# Patient Record
Sex: Female | Born: 1940 | Race: Black or African American | Hispanic: No | Marital: Married | Smoking: Current some day smoker
Health system: Southern US, Community
[De-identification: ages and names within clinical notes are randomized; demographics above are authoritative.]

## PROBLEM LIST (undated history)

## (undated) DIAGNOSIS — E119 Type 2 diabetes mellitus without complications: Secondary | ICD-10-CM

## (undated) DIAGNOSIS — M1990 Unspecified osteoarthritis, unspecified site: Secondary | ICD-10-CM

## (undated) DIAGNOSIS — J449 Chronic obstructive pulmonary disease, unspecified: Secondary | ICD-10-CM

## (undated) DIAGNOSIS — I2510 Atherosclerotic heart disease of native coronary artery without angina pectoris: Secondary | ICD-10-CM

## (undated) DIAGNOSIS — E785 Hyperlipidemia, unspecified: Secondary | ICD-10-CM

## (undated) DIAGNOSIS — I10 Essential (primary) hypertension: Secondary | ICD-10-CM

## (undated) DIAGNOSIS — I739 Peripheral vascular disease, unspecified: Secondary | ICD-10-CM

## (undated) HISTORY — DX: Hyperlipidemia, unspecified: E78.5

## (undated) HISTORY — DX: Type 2 diabetes mellitus without complications: E11.9

## (undated) HISTORY — PX: OTHER SURGICAL HISTORY: SHX169

## (undated) HISTORY — DX: Essential (primary) hypertension: I10

## (undated) HISTORY — DX: Unspecified osteoarthritis, unspecified site: M19.90

## (undated) HISTORY — PX: ABDOMINAL HYSTERECTOMY: SHX81

## (undated) HISTORY — DX: Peripheral vascular disease, unspecified: I73.9

## (undated) HISTORY — DX: Atherosclerotic heart disease of native coronary artery without angina pectoris: I25.10

---

## 1997-12-21 ENCOUNTER — Emergency Department (HOSPITAL_COMMUNITY): Admission: EM | Admit: 1997-12-21 | Discharge: 1997-12-21 | Payer: Self-pay | Admitting: Emergency Medicine

## 1999-03-15 ENCOUNTER — Inpatient Hospital Stay (HOSPITAL_COMMUNITY): Admission: EM | Admit: 1999-03-15 | Discharge: 1999-03-20 | Payer: Self-pay | Admitting: Emergency Medicine

## 1999-03-15 ENCOUNTER — Encounter: Payer: Self-pay | Admitting: Emergency Medicine

## 1999-03-19 ENCOUNTER — Encounter: Payer: Self-pay | Admitting: Surgery

## 2000-04-29 ENCOUNTER — Encounter: Payer: Self-pay | Admitting: Surgery

## 2000-04-29 ENCOUNTER — Encounter: Admission: RE | Admit: 2000-04-29 | Discharge: 2000-04-29 | Payer: Self-pay | Admitting: Surgery

## 2000-04-30 ENCOUNTER — Other Ambulatory Visit: Admission: RE | Admit: 2000-04-30 | Discharge: 2000-04-30 | Payer: Self-pay | Admitting: Obstetrics and Gynecology

## 2001-04-30 ENCOUNTER — Encounter: Payer: Self-pay | Admitting: Internal Medicine

## 2001-04-30 ENCOUNTER — Encounter: Admission: RE | Admit: 2001-04-30 | Discharge: 2001-04-30 | Payer: Self-pay | Admitting: Internal Medicine

## 2002-02-08 ENCOUNTER — Other Ambulatory Visit: Admission: RE | Admit: 2002-02-08 | Discharge: 2002-02-08 | Payer: Self-pay | Admitting: Obstetrics and Gynecology

## 2002-02-16 ENCOUNTER — Ambulatory Visit (HOSPITAL_COMMUNITY): Admission: RE | Admit: 2002-02-16 | Discharge: 2002-02-16 | Payer: Self-pay | Admitting: Cardiology

## 2002-02-16 ENCOUNTER — Encounter: Payer: Self-pay | Admitting: Cardiology

## 2002-07-27 ENCOUNTER — Other Ambulatory Visit: Admission: RE | Admit: 2002-07-27 | Discharge: 2002-07-27 | Payer: Self-pay | Admitting: Obstetrics and Gynecology

## 2003-02-10 ENCOUNTER — Other Ambulatory Visit: Admission: RE | Admit: 2003-02-10 | Discharge: 2003-02-10 | Payer: Self-pay | Admitting: Obstetrics and Gynecology

## 2003-05-05 ENCOUNTER — Encounter (INDEPENDENT_AMBULATORY_CARE_PROVIDER_SITE_OTHER): Payer: Self-pay | Admitting: Specialist

## 2003-05-05 ENCOUNTER — Ambulatory Visit (HOSPITAL_COMMUNITY): Admission: RE | Admit: 2003-05-05 | Discharge: 2003-05-05 | Payer: Self-pay | Admitting: *Deleted

## 2004-03-05 ENCOUNTER — Encounter: Admission: RE | Admit: 2004-03-05 | Discharge: 2004-03-05 | Payer: Self-pay | Admitting: Obstetrics and Gynecology

## 2004-08-25 HISTORY — PX: APPENDECTOMY: SHX54

## 2005-05-14 ENCOUNTER — Ambulatory Visit: Payer: Self-pay | Admitting: Internal Medicine

## 2005-05-21 ENCOUNTER — Other Ambulatory Visit: Admission: RE | Admit: 2005-05-21 | Discharge: 2005-05-21 | Payer: Self-pay | Admitting: Obstetrics and Gynecology

## 2005-05-28 ENCOUNTER — Ambulatory Visit: Payer: Self-pay | Admitting: Internal Medicine

## 2005-07-09 ENCOUNTER — Ambulatory Visit: Payer: Self-pay | Admitting: Internal Medicine

## 2005-08-27 ENCOUNTER — Ambulatory Visit: Payer: Self-pay | Admitting: Internal Medicine

## 2005-09-03 ENCOUNTER — Ambulatory Visit: Payer: Self-pay | Admitting: Internal Medicine

## 2005-10-27 ENCOUNTER — Ambulatory Visit: Payer: Self-pay | Admitting: Internal Medicine

## 2005-10-29 ENCOUNTER — Ambulatory Visit: Payer: Self-pay | Admitting: Internal Medicine

## 2005-12-03 ENCOUNTER — Ambulatory Visit: Payer: Self-pay | Admitting: Internal Medicine

## 2006-03-02 ENCOUNTER — Ambulatory Visit: Payer: Self-pay | Admitting: Internal Medicine

## 2006-03-04 ENCOUNTER — Ambulatory Visit: Payer: Self-pay | Admitting: Internal Medicine

## 2006-04-20 ENCOUNTER — Ambulatory Visit: Payer: Self-pay | Admitting: Internal Medicine

## 2006-04-22 ENCOUNTER — Ambulatory Visit: Payer: Self-pay | Admitting: Internal Medicine

## 2006-06-19 ENCOUNTER — Ambulatory Visit (HOSPITAL_COMMUNITY): Admission: RE | Admit: 2006-06-19 | Discharge: 2006-06-19 | Payer: Self-pay | Admitting: Internal Medicine

## 2006-07-22 ENCOUNTER — Ambulatory Visit: Payer: Self-pay | Admitting: Internal Medicine

## 2007-06-08 DIAGNOSIS — I119 Hypertensive heart disease without heart failure: Secondary | ICD-10-CM

## 2007-06-08 DIAGNOSIS — E119 Type 2 diabetes mellitus without complications: Secondary | ICD-10-CM | POA: Insufficient documentation

## 2007-06-08 DIAGNOSIS — I10 Essential (primary) hypertension: Secondary | ICD-10-CM | POA: Insufficient documentation

## 2007-06-08 DIAGNOSIS — E7800 Pure hypercholesterolemia, unspecified: Secondary | ICD-10-CM | POA: Insufficient documentation

## 2007-06-08 DIAGNOSIS — K219 Gastro-esophageal reflux disease without esophagitis: Secondary | ICD-10-CM

## 2009-10-19 ENCOUNTER — Encounter (INDEPENDENT_AMBULATORY_CARE_PROVIDER_SITE_OTHER): Payer: Self-pay | Admitting: Obstetrics and Gynecology

## 2009-10-19 ENCOUNTER — Inpatient Hospital Stay (HOSPITAL_COMMUNITY): Admission: RE | Admit: 2009-10-19 | Discharge: 2009-10-21 | Payer: Self-pay | Admitting: Obstetrics and Gynecology

## 2010-02-27 ENCOUNTER — Ambulatory Visit (HOSPITAL_COMMUNITY): Admission: RE | Admit: 2010-02-27 | Discharge: 2010-02-27 | Payer: Self-pay | Admitting: Orthopedic Surgery

## 2010-02-27 ENCOUNTER — Ambulatory Visit: Payer: Self-pay | Admitting: Vascular Surgery

## 2010-04-01 ENCOUNTER — Ambulatory Visit: Payer: Self-pay | Admitting: Surgery

## 2010-04-10 ENCOUNTER — Ambulatory Visit: Payer: Self-pay | Admitting: Surgery

## 2010-04-10 ENCOUNTER — Ambulatory Visit (HOSPITAL_COMMUNITY): Admission: RE | Admit: 2010-04-10 | Discharge: 2010-04-10 | Payer: Self-pay | Admitting: Surgery

## 2010-04-15 ENCOUNTER — Ambulatory Visit: Payer: Self-pay | Admitting: Surgery

## 2010-04-25 ENCOUNTER — Inpatient Hospital Stay (HOSPITAL_COMMUNITY): Admission: RE | Admit: 2010-04-25 | Discharge: 2010-04-27 | Payer: Self-pay | Admitting: Surgery

## 2010-04-25 HISTORY — PX: PR VEIN BYPASS GRAFT,AORTO-FEM-POP: 35551

## 2010-05-13 ENCOUNTER — Ambulatory Visit: Payer: Self-pay | Admitting: Surgery

## 2010-08-14 ENCOUNTER — Ambulatory Visit: Payer: Self-pay | Admitting: Surgery

## 2010-11-07 LAB — CBC
HCT: 48.2 % — ABNORMAL HIGH (ref 36.0–46.0)
Hemoglobin: 13.8 g/dL (ref 12.0–15.0)
Hemoglobin: 16.8 g/dL — ABNORMAL HIGH (ref 12.0–15.0)
MCH: 29.4 pg (ref 26.0–34.0)
MCH: 29.9 pg (ref 26.0–34.0)
MCHC: 34.7 g/dL (ref 30.0–36.0)
MCV: 84.7 fL (ref 78.0–100.0)
MCV: 85.8 fL (ref 78.0–100.0)
Platelets: 213 10*3/uL (ref 150–400)
RBC: 4.7 MIL/uL (ref 3.87–5.11)

## 2010-11-07 LAB — GLUCOSE, CAPILLARY
Glucose-Capillary: 113 mg/dL — ABNORMAL HIGH (ref 70–99)
Glucose-Capillary: 120 mg/dL — ABNORMAL HIGH (ref 70–99)
Glucose-Capillary: 138 mg/dL — ABNORMAL HIGH (ref 70–99)
Glucose-Capillary: 140 mg/dL — ABNORMAL HIGH (ref 70–99)

## 2010-11-07 LAB — URINALYSIS, ROUTINE W REFLEX MICROSCOPIC
Hgb urine dipstick: NEGATIVE
Ketones, ur: NEGATIVE mg/dL
Protein, ur: NEGATIVE mg/dL
Specific Gravity, Urine: 1.013 (ref 1.005–1.030)
Urobilinogen, UA: 0.2 mg/dL (ref 0.0–1.0)

## 2010-11-07 LAB — COMPREHENSIVE METABOLIC PANEL
BUN: 12 mg/dL (ref 6–23)
CO2: 28 mEq/L (ref 19–32)
Creatinine, Ser: 0.72 mg/dL (ref 0.4–1.2)
Potassium: 4.6 mEq/L (ref 3.5–5.1)
Sodium: 137 mEq/L (ref 135–145)
Total Bilirubin: 0.3 mg/dL (ref 0.3–1.2)

## 2010-11-07 LAB — BASIC METABOLIC PANEL
BUN: 8 mg/dL (ref 6–23)
CO2: 24 mEq/L (ref 19–32)
Calcium: 9 mg/dL (ref 8.4–10.5)
Chloride: 108 mEq/L (ref 96–112)
GFR calc Af Amer: >60 mL/min (ref >60)
Potassium: 3.5 mEq/L (ref 3.5–5.1)

## 2010-11-07 LAB — POCT I-STAT, CHEM 8
BUN: 10 mg/dL (ref 6–23)
Creatinine, Ser: 0.6 mg/dL (ref 0.4–1.2)
Glucose, Bld: 132 mg/dL — ABNORMAL HIGH (ref 70–99)
HCT: 49 % — ABNORMAL HIGH (ref 36.0–46.0)
Hemoglobin: 16.7 g/dL — ABNORMAL HIGH (ref 12.0–15.0)

## 2010-11-07 LAB — TYPE AND SCREEN
ABO/RH(D): B POS
Antibody Screen: NEGATIVE

## 2010-11-07 LAB — PROTIME-INR: Prothrombin Time: 12.4 seconds (ref 11.6–15.2)

## 2010-11-07 LAB — APTT: aPTT: 30 seconds (ref 24–37)

## 2010-11-14 LAB — COMPREHENSIVE METABOLIC PANEL
AST: 22 U/L (ref 0–37)
CO2: 25 mEq/L (ref 19–32)
Calcium: 9.5 mg/dL (ref 8.4–10.5)
Creatinine, Ser: 0.63 mg/dL (ref 0.4–1.2)
GFR calc Af Amer: >60 mL/min (ref >60)

## 2010-11-14 LAB — GLUCOSE, CAPILLARY
Glucose-Capillary: 113 mg/dL — ABNORMAL HIGH (ref 70–99)
Glucose-Capillary: 128 mg/dL — ABNORMAL HIGH (ref 70–99)

## 2010-11-14 LAB — CBC
Hemoglobin: 11.8 g/dL — ABNORMAL LOW (ref 12.0–15.0)
MCHC: 33.1 g/dL (ref 30.0–36.0)
MCHC: 33.5 g/dL (ref 30.0–36.0)
MCV: 89.2 fL (ref 78.0–100.0)
MCV: 90.1 fL (ref 78.0–100.0)
Platelets: 219 10*3/uL (ref 150–400)
RBC: 3.9 MIL/uL (ref 3.87–5.11)
RDW: 14.1 % (ref 11.5–15.5)

## 2011-01-07 NOTE — Procedures (Signed)
BYPASS GRAFT EVALUATION   INDICATION:  Follow up right femoral to popliteal bypass graft.   HISTORY:  Diabetes:  Yes.  Cardiac:  No.  Hypertension:  Yes.  Smoking:  Yes.  Previous Surgery:  Right femoral-to-popliteal bypass graft with Propaten  graft material.   SINGLE LEVEL ARTERIAL EXAM                               RIGHT              LEFT  Brachial:                    138                139  Anterior tibial:             137                128  Posterior tibial:            140                137  Peroneal:  Ankle/brachial index:        1.01               0.99   PREVIOUS ABI:  Date: 05/13/2010  RIGHT:  1.03  LEFT:  1.04   LOWER EXTREMITY BYPASS GRAFT DUPLEX EXAM:   DUPLEX:  Patent right femoral-to-popliteal bypass graft with triphasic  and biphasic waveforms.  No evidence of focal stenosis visualized.  There is questionable plaque visualized within the body of the graft.   IMPRESSION:  1. Stable ankle brachial indices.  2. Patent right femoral-to-popliteal bypass graft with normal      velocities.  Some plaque seen throughout the body of the bypass      graft.   ___________________________________________  V. Charlena Cross, MD   OD/MEDQ  D:  08/14/2010  T:  08/14/2010  Job:  [id number]

## 2011-01-07 NOTE — Procedures (Signed)
VASCULAR LAB EXAM   INDICATION:  Preop vein mapping for lower extremity bypass graft.   HISTORY:  Diabetes:  No.  Cardiac:  No.  Hypertension:  Yes.   EXAM:  Bilateral greater saphenous vein and small saphenous vein  mapping.   IMPRESSION:  Patent bilateral greater saphenous vein and small saphenous  vein with measurements attached.   ___________________________________________  V. Charlena Cross, MD   EM/MEDQ  D:  04/15/2010  T:  04/15/2010  Job:  [id number]

## 2011-01-07 NOTE — Procedures (Signed)
CAROTID DUPLEX EXAM   INDICATION:  Preop for lower extremity arterial surgery.   HISTORY:  Diabetes:  Yes.  Cardiac:  No.  Hypertension:  Yes.  Smoking:  Yes.  Previous Surgery:  No.  CV History:  Currently asymptomatic.  Amaurosis Fugax No, Paresthesias No, Hemiparesis No.                                       RIGHT             LEFT  Brachial systolic pressure:         154               148  Brachial Doppler waveforms:         Normal            Normal  Vertebral direction of flow:        Antegrade         Antegrade  DUPLEX VELOCITIES (cm/sec)  CCA peak systolic                   67                76  ECA peak systolic                   72                49  ICA peak systolic                   63                49  ICA end diastolic                   17                17  PLAQUE MORPHOLOGY:                  Heterogenous      Heterogenous  PLAQUE AMOUNT:                      Mild              Mild  PLAQUE LOCATION:                    ICA/ECA           ICA/ECA   IMPRESSION:  No hemodynamically significant stenosis of the bilateral  internal carotid arteries noted with mild plaque formations noted in the  proximal segment.   ___________________________________________  V. Charlena Cross, MD   CH/MEDQ  D:  04/15/2010  T:  04/15/2010  Job:  [id number]

## 2011-01-07 NOTE — Assessment & Plan Note (Signed)
OFFICE VISIT   Allison [name], Allison [name]  DOB:  18-Feb-1941                                       04/15/2010  ZOXWR#:[id number]   The patient comes back today having undergone arteriogram.  She is a 70-  year-old female with right leg pain.  Pain is in the anterior thigh.  It  occurs with walking.  This has been lifestyle limiting for her.  She can  only go approximately half a block before she has to stop.  Her symptoms  improve with rest.  Her arteriogram showed an occluded superficial  femoral artery with reconstitution of the above-knee popliteal artery.  Her ankle brachial indices are 0.5 on the right and 1 on the left.   The patient is medically treated for diabetes, hypertension,  hypercholesterolemia.   She continues to smoke, although she has significantly cut back.   PHYSICAL EXAMINATION:  Heart rate is 83, blood pressure 124/75,  temperature 97.9.  General:  She is well-appearing, in no distress.  Respirations nonlabored.  HEENT:  Within normal limits.  Abdomen:  Soft,  nontender.  Cardiovascular:  Regular rate and rhythm.  Pedal pulses not  palpable on the right.  Neuro:  She has no focal deficits.  Skin:  Without rash.   ASSESSMENT/PLAN:  Atypical right leg pain.  I had a lengthy conversation  with the patient today, approximately 30 minutes, detailing the findings  of her arteriogram.  I told her that I believe her symptoms are somewhat  atypical for claudication.  However, there are aspects of her symptoms  that would go along with arterial insufficiency.  After our discussions,  I feel that proceeding with right femoral popliteal bypass graft has a  reasonable chance of improving her symptoms and well as her quality of  life.  This has been scheduled for September 1st.  We talked about the  risks and benefits of the surgery.  I did vein map her today.  She does  not have adequate vein, so this will need be with Gore-Tex.  She is also  getting a  carotid duplex in the office today, and she will get a cardiac  Myoview prior to her operation.     Jorge Ny, MD  Electronically Signed   VWB/MEDQ  D:  04/15/2010  T:  04/16/2010  Job:  [id number]   cc:   Marlowe Kays, M.D.  Deirdre Peer. Polite, M.D.

## 2011-01-07 NOTE — Assessment & Plan Note (Signed)
OFFICE VISIT   [name], [name]  DOB:  Jun 16, 1941                                       04/01/2010  UXLKG#:[id number]   PRIMARY CARE PHYSICIAN:  Dr. Nehemiah Settle.   HISTORY:  This is a 70 year old female seen at the request of Dr.  Simonne Come for evaluation of right leg pain.  The patient has recently  had a lower extremity ultrasound which showed normal left leg and  occluded right superficial femoral artery.  The patient states that for  the past 6 months to a year she has been having trouble walking and it  has gotten progressively worse over the last month.  It used to be  relieved with Tylenol.  However, that is no longer the case.  She can  walk approximately half a block to where she gets claudication-like  symptoms requiring her to stop.  She denies rest pain.  She denies any  nonhealing wounds.   The patient suffers from diabetes, hypertension and hypercholesterolemia  all of which are medically managed.  Her other risk factor is smoking.   REVIEW OF SYSTEMS:  GENERAL:  Negative fevers, chills, weight gain or  weight loss.  VASCULAR:  Positive pain in legs with walking.  CARDIAC:  Negative.  GI:  Negative.  NEURO:  Negative.  PULMONARY:  Negative.  HEME:  Negative.  GU:  Negative.  EENT:  Negative.  MUSCULOSKELETAL:  Positive arthritis and joint pain.  PSYCH:  Negative.  SKIN:  Negative.   PAST MEDICAL HISTORY:  Diabetes, hypertension, hypercholesterolemia.   PAST SURGICAL HISTORY:  Hysterectomy, appendectomy, bilateral bone spur  removal on her feet.   SOCIAL HISTORY:  She is retired.  Married with two children.  Currently  smokes half pack a day.  Does not drink.   FAMILY HISTORY:  The patient does not have any knowledge of her mother  or father.   PHYSICAL EXAM:  Vital signs:  Heart rate 72, blood pressure 153/77, O2  saturations 99%.  General:  She is well-appearing, no distress.  HEENT:  Within normal limits.  Lungs are clear  bilaterally.  Cardiovascular:  Regular rate and rhythm.  No carotid bruits, palpable bilateral femoral  pulses.  Left dorsalis pedis pulse is palpable.  Right pedal pulses are  not palpable.  Abdomen:  Soft, nontender.  No hepatosplenomegaly.  Aortic pulse is palpable and appears nonaneurysmal.  Musculoskeletal:  Without major deformities.  Neuro was without focal weakness.  Skin is  without rash.   ASSESSMENT/PLAN:  Right leg lifestyle-limiting claudication.   PLAN:  I discussed the spectrum of disease related to peripheral  vascular disease, but due to the patient's symptomatology I feel the  next course of action is to proceed with diagnostic arteriography.  We  discussed proceeding with intervention if it is amenable to percutaneous  intervention.  If not we will contemplate surgical revascularization.  I  have scheduled her arteriogram for Wednesday, August 17.  I discussed  the risks and benefits including risks of bleeding, risk of embolization  with the patient.  She understands and wishes to proceed.     Jorge Ny, MD  Electronically Signed   VWB/MEDQ  D:  04/01/2010  T:  04/02/2010  Job:  [id number]   cc:   Deirdre Peer. Polite, M.D.  Marlowe Kays, M.D.

## 2011-01-07 NOTE — Assessment & Plan Note (Signed)
OFFICE VISIT   [name], [name]  DOB:  08/03/1941                                       05/13/2010  ONGEX#:[id number]   REASON FOR VISIT:  Status post right femoral-popliteal.   The patient is a 70 year old female with atypical claudication and an  occluded right superficial femoral artery.  She did not have vein for  bypass but due to the significance of her symptoms, I elected to proceed  with above-knee femoral-popliteal bypass graft with a 6-mm Propaten  graft.  She has had dramatic improvement in her symptoms.  She states  that she has not felt this good in over a year.  Did have a slight  eschar on her above-knee incision.  I cleaned this off in the office.  There was a slight 1-mm skin separation.  I elected to put a dry gauze  dressing over it just to protect her from the drainage.  I suspect this  will be completely healed within the next week or so.  She will come  back to see me in 3 months for an ultrasound evaluation.     Jorge Ny, MD  Electronically Signed   VWB/MEDQ  D:  05/13/2010  T:  05/14/2010  Job:  [id number]   cc:   Marlowe Kays, M.D.  Deirdre Peer. Polite, M.D.

## 2011-01-10 NOTE — Op Note (Signed)
   NAME:  Allison [name], Allison [name]                            ACCOUNT NO.:  [id number]   MEDICAL RECORD NO.:  [id number]                   PATIENT TYPE:  AMB   LOCATION:  ENDO                                 FACILITY:  [facility]   PHYSICIAN:  Georgiana Spinner, M.D.                 DATE OF BIRTH:  12/19/1940   DATE OF PROCEDURE:  05/05/2003  DATE OF DISCHARGE:                                 OPERATIVE REPORT   PROCEDURE:  Colonoscopy.   INDICATIONS:  Colon cancer screening.   ANESTHESIA:  1. Demerol 20 mg.  2. Versed 2 mg.   DESCRIPTION OF PROCEDURE:  With patient mildly sedated in the left lateral  decubitus position, the Olympus videoscopic colonoscope was inserted in the  rectum and passed rather easily to the cecum, identified by the ileocecal  valve and base of the cecum, which were photographed.  From this point, the  colonoscope was slowly withdrawn, taking circumferential views of colonic  mucosa, stopping only in the rectum which appeared normal on direct and  retroflexed view.  The endoscope was straightened and withdrawn.  The  patient's vital signs and pulse oximeter remained stable.  The patient  tolerated the procedure well without apparent complications.   FINDINGS:  Unremarkable colonoscopic examination.   PLAN:  Repeat examination in 5-10 years.                                               Georgiana Spinner, M.D.    GMO/MEDQ  D:  05/05/2003  T:  05/05/2003  Job:  [id number]

## 2011-01-10 NOTE — Op Note (Signed)
   NAME:  Allison [name], Allison [name]                            ACCOUNT NO.:  [id number]   MEDICAL RECORD NO.:  [id number]                   PATIENT TYPE:  AMB   LOCATION:  ENDO                                 FACILITY:  [facility]   PHYSICIAN:  Georgiana Spinner, M.D.                 DATE OF BIRTH:  1941-02-20   DATE OF PROCEDURE:  05/05/2003  DATE OF DISCHARGE:                                 OPERATIVE REPORT   PROCEDURE:  Upper endoscopy.   INDICATIONS:  GERD.   ANESTHESIA:  1. Demerol 60 mg.  2. Versed 5 mg.   DESCRIPTION OF PROCEDURE:  With patient mildly sedated in the left lateral  decubitus position, the Olympus videoscopic endoscope was inserted in the  mouth, passed under direct vision through the esophagus which appeared  normal, although the distal esophagus appeared to be somewhat irregular at  the Z-line and appeared that there was inflamed gastric mucosa adjacent to  the distal esophagus.  This was photographed.  We entered into the stomach.  Fundus, body, antrum, duodenal bulb, second portion of duodenum were  visualized.  From this point, the endoscope was slowly withdrawn, taking  circumferential views of the duodenal mucosa, stopping in the bulb where two  inflamed areas were seen, photographed, and biopsied.  The scope was pulled  back into the stomach, placed in retroflexion to view the stomach from  below.  The endoscope was then straightened and withdrawn, taking  circumferential views of the remaining gastric and esophageal mucosa,  stopping in the antrum where patchy erythema was noted, photographed, and  also biopsied separately.  And in the fundus of the stomach, a glandular-  like appearance of the mucosa between the folds was photographed also  biopsied.  The patient's vital signs and pulse oximeter remained stable.  The patient tolerated the procedure well without apparent complications.   FINDINGS:  Changes as described above in fundus, antrum, and in the duodenal   bulb.   PLAN:  1. Await biopsy report.  2. The patient will call me for results and follow up with me as an     outpatient.  3. Proceed to colonoscopy, as planned.                                               Georgiana Spinner, M.D.    GMO/MEDQ  D:  05/05/2003  T:  05/05/2003  Job:  [id number]

## 2011-07-19 IMAGING — CR DG CHEST 2V
2 series · 2 of 2 positions shown · non-contrast
Comparison: None.

CLINICAL DATA: Preoperative film for patient with peripheral
vascular disease.

CHEST - 2 VIEW

[view not recorded (1 of 2)]
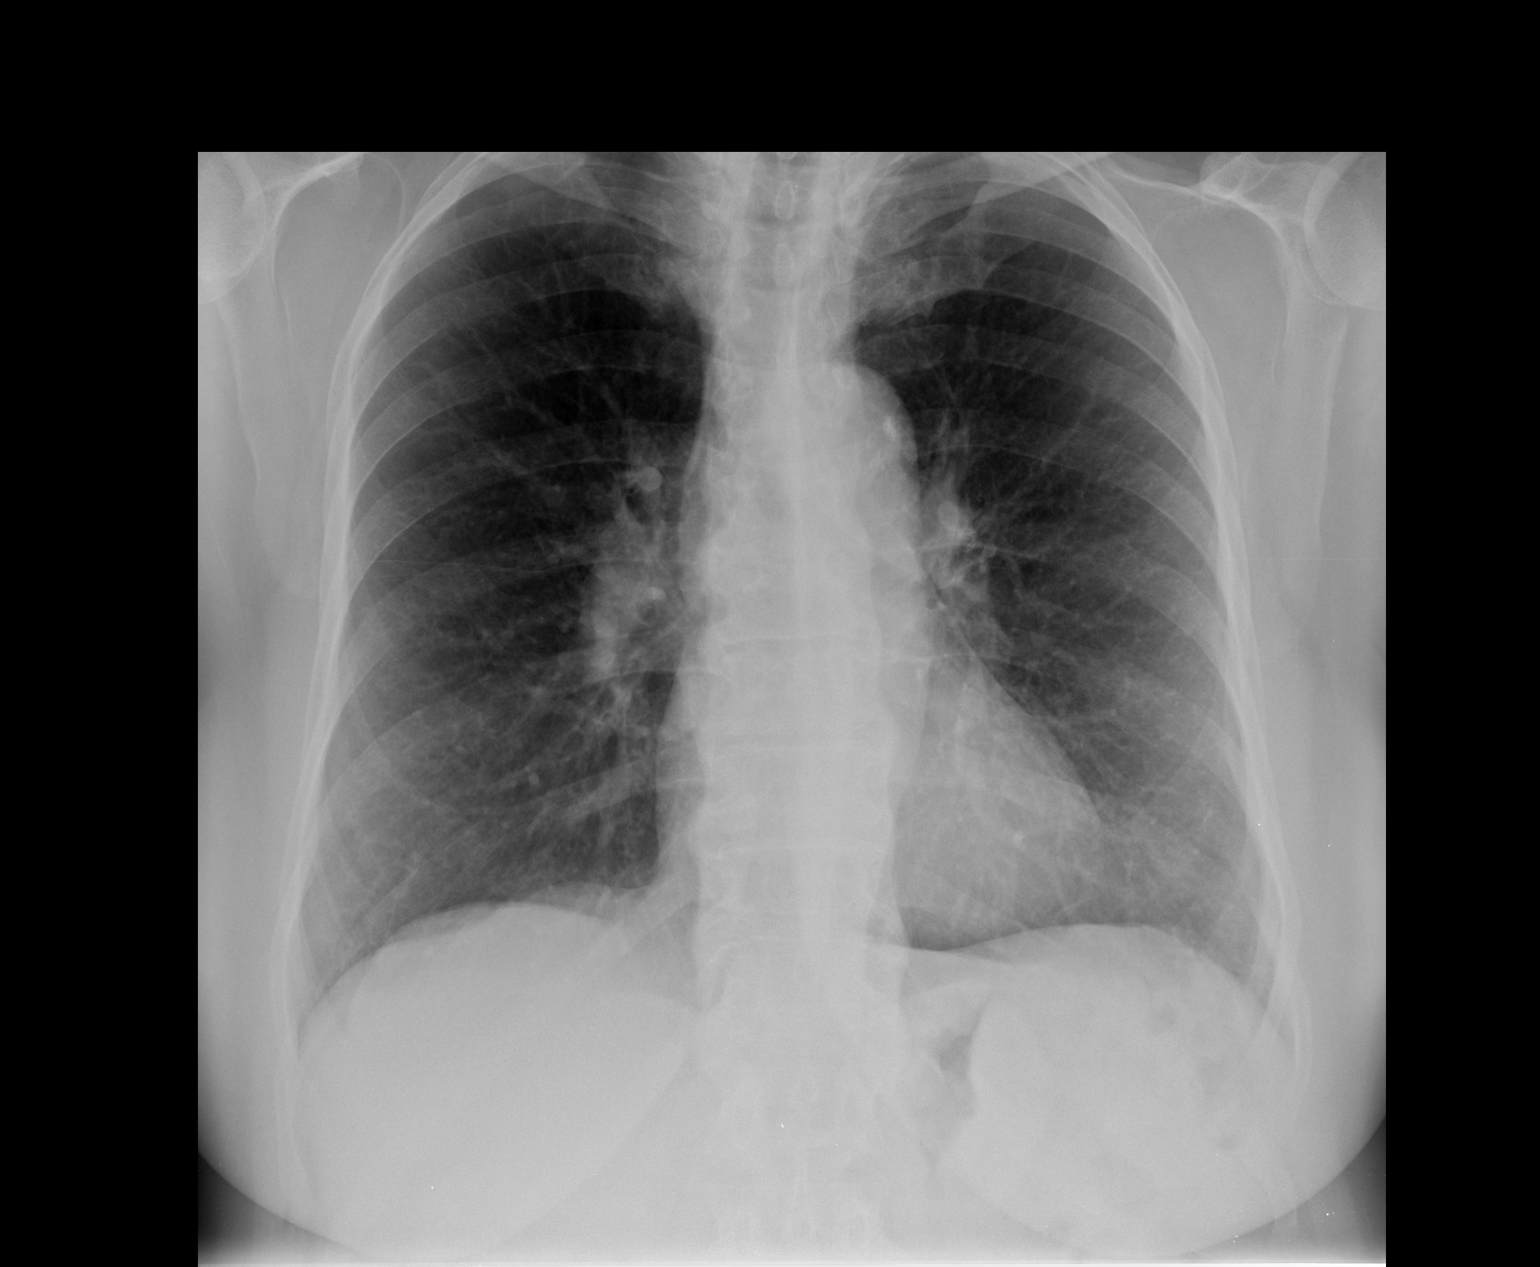

[view not recorded (2 of 2)]
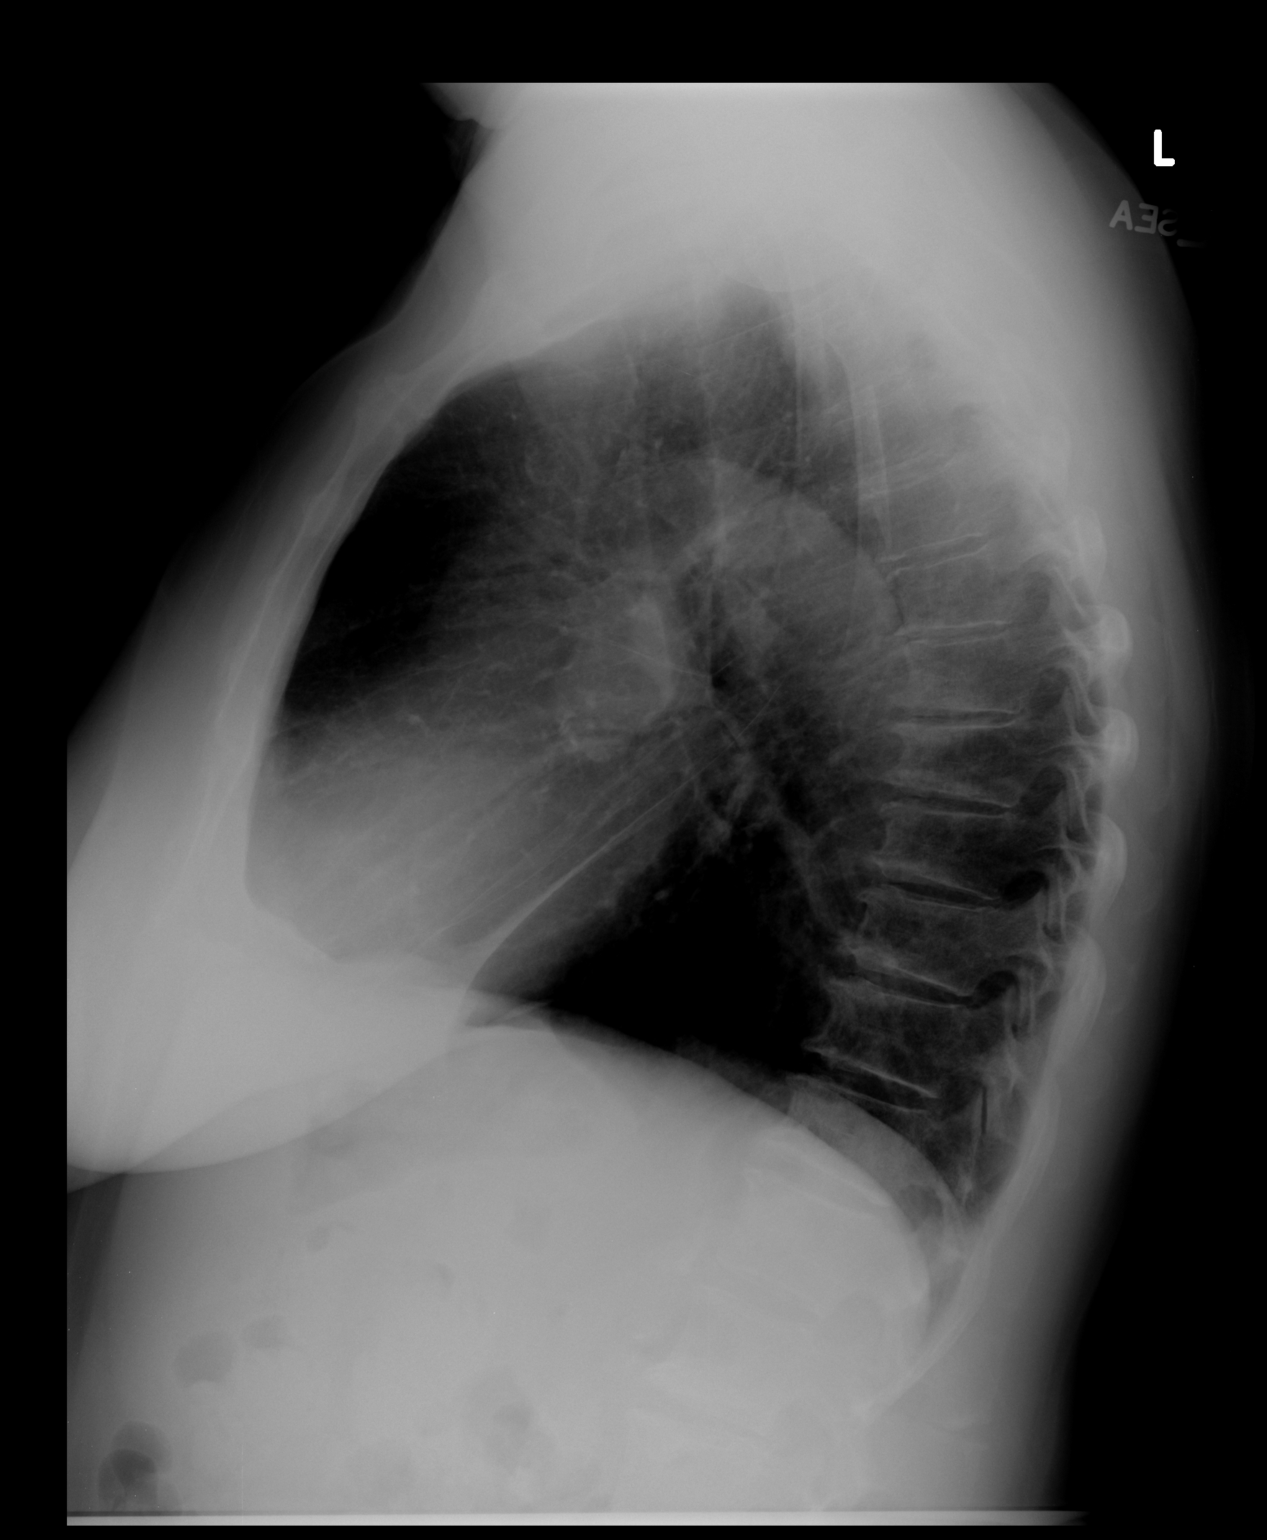

[2 of 2 positions shown; findings below may reference images not displayed]

FINDINGS: The chest is hyperexpanded but the lungs are clear.
Heart size is normal.  There is no pleural effusion or focal bony
abnormality.
IMPRESSION: Hyperexpansion compatible with emphysema.  No acute abnormality.

## 2012-05-10 ENCOUNTER — Encounter: Payer: Self-pay | Admitting: Vascular Surgery

## 2012-06-03 ENCOUNTER — Other Ambulatory Visit: Payer: Self-pay | Admitting: *Deleted

## 2012-06-03 DIAGNOSIS — I739 Peripheral vascular disease, unspecified: Secondary | ICD-10-CM

## 2012-06-03 DIAGNOSIS — Z48812 Encounter for surgical aftercare following surgery on the circulatory system: Secondary | ICD-10-CM

## 2012-06-08 ENCOUNTER — Encounter: Payer: Self-pay | Admitting: Neurosurgery

## 2012-06-09 ENCOUNTER — Ambulatory Visit (INDEPENDENT_AMBULATORY_CARE_PROVIDER_SITE_OTHER): Payer: Medicare Other | Admitting: Neurosurgery

## 2012-06-09 ENCOUNTER — Encounter: Payer: Self-pay | Admitting: Neurosurgery

## 2012-06-09 ENCOUNTER — Encounter (INDEPENDENT_AMBULATORY_CARE_PROVIDER_SITE_OTHER): Payer: Medicare Other | Admitting: *Deleted

## 2012-06-09 VITALS — BP 139/87 | HR 82 | Resp 16 | Ht 64.0 in | Wt 148.8 lb

## 2012-06-09 DIAGNOSIS — Z48812 Encounter for surgical aftercare following surgery on the circulatory system: Secondary | ICD-10-CM

## 2012-06-09 DIAGNOSIS — I739 Peripheral vascular disease, unspecified: Secondary | ICD-10-CM

## 2012-06-09 NOTE — Progress Notes (Signed)
[facility] [place] PAD/PVD Office Note  CC: PVD surveillance Referring Physician: Brabham  History of Present Illness: 71 year old female patient of Dr. Myra Gianotti status post a right femoropopliteal artery bypass in 2011. The patient denies any claudication, rest pain or open ulcerations. The patient states she can walk and carry out her ADLs without any difficulty.  Past Medical History  Diagnosis Date  . Peripheral vascular disease   . Arthritis   . Hypertension   . Hyperlipidemia   . 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    ROS: [x]  Positive   [ ]  Denies    General: [ ]  Weight loss, [ ]  Fever, [ ]  chills Neurologic: [ ]  Dizziness, [ ]  Blackouts, [ ]  Seizure [ ]  Stroke, [ ]  "Mini stroke", [ ]  Slurred speech, [ ]  Temporary blindness; [ ]  weakness in arms or legs, [ ]  Hoarseness Cardiac: [ ]  Chest pain/pressure, [ ]  Shortness of breath at rest [ ]  Shortness of breath with exertion, [ ]  Atrial fibrillation or irregular heartbeat Vascular: [ ]  Pain in legs with walking, [ ]  Pain in legs at rest, [ ]  Pain in legs at night,  [ ]  Non-healing ulcer, [ ]  Blood clot in vein/DVT,   Pulmonary: [ ]  Home oxygen, [ ]  Productive cough, [ ]  Coughing up blood, [ ]  Asthma,  [ ]  Wheezing Musculoskeletal:  [ ]  Arthritis, [ ]  Low back pain, [ ]  Joint pain Hematologic: [ ]  Easy Bruising, [ ]  Anemia; [ ]  Hepatitis Gastrointestinal: [ ]  Blood in stool, [ ]  Gastroesophageal Reflux/heartburn, [ ]  Trouble swallowing Urinary: [ ]  chronic Kidney disease, [ ]  on HD - [ ]  MWF or [ ]  TTHS, [ ]  Burning with urination, [ ]  Difficulty urinating Skin: [ ]  Rashes, [ ]  Wounds Psychological: [ ]  Anxiety, [ ]  Depression   Social History History  Substance Use Topics  . Smoking status: Current Every Day Smoker  . Smokeless tobacco: Not on file  . Alcohol Use: No    Family History Family History  Problem Relation Age of Onset  . Family history unknown: Yes    Allergies    Allergen Reactions  . Cefaclor   . Minocycline Hcl   . Nitrofurantoin   . Sucralfate     Current Outpatient Prescriptions  Medication Sig Dispense Refill  . atorvastatin (LIPITOR) 40 MG tablet daily.      . hydrochlorothiazide (HYDRODIURIL) 25 MG tablet Take 25 mg by mouth daily.      Marland Kitchen lisinopril (PRINIVIL,ZESTRIL) 40 MG tablet Take 40 mg by mouth daily.      . Multiple Vitamin (MULTIVITAMIN) tablet Take 1 tablet by mouth daily.      . SitaGLIPtin-MetFORMIN HCl (JANUMET PO) Take by mouth.      . Amlodipine-Olmesartan (AZOR PO) Take by mouth.      . naproxen sodium (ANAPROX) 220 MG tablet Take 220 mg by mouth 2 (two) times daily with a meal.      . rosuvastatin (CRESTOR) 20 MG tablet Take 20 mg by mouth daily.        Physical Examination  Filed Vitals:   06/09/12 1023  BP: 139/87  Pulse: 82  Resp: 16    Body mass index is 25.54 kg/(m^2).  General:  WDWN in NAD Gait: Normal HEENT: WNL Eyes: Pupils equal Pulmonary: normal non-labored breathing , without Rales, rhonchi,  wheezing Cardiac: RRR, without  Murmurs, rubs or gallops; No carotid bruits Abdomen: soft, NT, no  masses Skin: no rashes, ulcers noted Vascular Exam/Pulses: Palpable femoral pulses as well as PT and DP pulses bilaterally  Extremities without ischemic changes, no Gangrene , no cellulitis; no open wounds;  Musculoskeletal: no muscle wasting or atrophy  Neurologic: A&O X 3; Appropriate Affect ; SENSATION: normal; MOTOR FUNCTION:  moving all extremities equally. Speech is fluent/normal  Non-Invasive Vascular Imaging: ABIs today are 1.05 and biphasic on the right, 1.05 on the left compared to previous exam in December 2011 when she was 1.01 on the right and 0.99 on the left  ASSESSMENT/PLAN: Asymptomatic patient status post right femoral to above-the-knee popliteal artery bypass in 2011 doing well. The patient will followup in one year with repeat duplex graft as well as ABIs. The patient is in agreement with  this plan, her questions were encouraged and answered.  Lauree Chandler ANP  Clinic M.D.: Edilia Bo

## 2012-06-09 NOTE — Addendum Note (Signed)
Addended by: Melodye Ped C on: 06/09/2012 04:03 PM   Modules accepted: Orders

## 2013-06-08 ENCOUNTER — Ambulatory Visit: Payer: Medicare Other | Admitting: Family

## 2013-06-08 ENCOUNTER — Other Ambulatory Visit (HOSPITAL_COMMUNITY): Payer: Medicare Other

## 2013-06-08 ENCOUNTER — Encounter (HOSPITAL_COMMUNITY): Payer: Medicare Other

## 2013-06-10 ENCOUNTER — Ambulatory Visit: Payer: Medicare Other | Admitting: Family

## 2013-06-15 ENCOUNTER — Ambulatory Visit: Payer: Medicare Other | Admitting: Neurosurgery

## 2013-06-21 ENCOUNTER — Encounter: Payer: Self-pay | Admitting: Family

## 2013-06-22 ENCOUNTER — Encounter (INDEPENDENT_AMBULATORY_CARE_PROVIDER_SITE_OTHER): Payer: Self-pay

## 2013-06-22 ENCOUNTER — Ambulatory Visit (INDEPENDENT_AMBULATORY_CARE_PROVIDER_SITE_OTHER)
Admission: RE | Admit: 2013-06-22 | Discharge: 2013-06-22 | Disposition: A | Discharge status: Home / Self Care | Payer: Medicare Other | Source: Ambulatory Visit | Attending: Neurosurgery | Admitting: Neurosurgery

## 2013-06-22 ENCOUNTER — Encounter: Payer: Self-pay | Admitting: Family

## 2013-06-22 ENCOUNTER — Ambulatory Visit (INDEPENDENT_AMBULATORY_CARE_PROVIDER_SITE_OTHER): Payer: Medicare Other | Admitting: Family

## 2013-06-22 ENCOUNTER — Ambulatory Visit (HOSPITAL_COMMUNITY)
Admission: RE | Admit: 2013-06-22 | Discharge: 2013-06-22 | Disposition: A | Discharge status: Home / Self Care | Payer: Medicare Other | Source: Ambulatory Visit | Attending: Family | Admitting: Family

## 2013-06-22 VITALS — BP 155/84 | HR 74 | Resp 16 | Ht 64.5 in | Wt 152.0 lb

## 2013-06-22 DIAGNOSIS — Z48812 Encounter for surgical aftercare following surgery on the circulatory system: Secondary | ICD-10-CM | POA: Insufficient documentation

## 2013-06-22 DIAGNOSIS — I739 Peripheral vascular disease, unspecified: Secondary | ICD-10-CM

## 2013-06-22 NOTE — Progress Notes (Signed)
[facility] [place] HISTORY AND PHYSICAL -PAD  History of Present Illness Allison [name] is a 72 y.o. female patient of Dr. Myra Gianotti status post a right femoropopliteal artery bypass in 2011.  She returns today for Duplex surveillance of RLE. Has not yet eaten nor taken any of her morning medications including antihypertensives.  States her blood pressure at home was 107 systolic yesterday. Is not walking much but denies any claudication symptoms; does have a treadmill and stationary bike. States her PCP is working with her re her smoking; statess he has gone through a smoking cessation program, used nicotine patches and gum, none have worked for her.   Pt. denies rest pain; denies night pain denies non healing ulcers on legs/feet, denies cardiac problems.  Patient denies New Medical or Surgical History.   Pt Diabetic: Yes, states in good control Pt smoker: smoker  (4-5 cigarettes/day, x 60 yrs)  Pt meds include: Statin :Yes ASA: Yes Other anticoagulants/antiplatelets: no  Past Medical History  Diagnosis Date  . Peripheral vascular disease   . Arthritis   . Hypertension   . Hyperlipidemia   . 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  . CAD (coronary artery disease)     Social History History  Substance Use Topics  . Smoking status: Current Every Day Smoker  . Smokeless tobacco: Never Used  . Alcohol Use: No    Family History Family History  Problem Relation Age of Onset  . Family history unknown: Yes    Past Surgical History  Procedure Laterality Date  . Pr vein bypass graft,aorto-fem-pop  04-25-2010    Right Fem-Pop BPG  . Abdominal hysterectomy    . Appendectomy  2006  . Bone spur removal      Both feet    Allergies  Allergen Reactions  . Cefaclor   . Minocycline Hcl   . Nitrofurantoin   . Sucralfate     Current Outpatient Prescriptions  Medication Sig Dispense Refill  . Amlodipine-Olmesartan (AZOR PO) Take by mouth.       Marland Kitchen aspirin 81 MG tablet Take 81 mg by mouth daily.      Marland Kitchen atorvastatin (LIPITOR) 40 MG tablet daily.      . hydrochlorothiazide (HYDRODIURIL) 25 MG tablet Take 25 mg by mouth daily.      Marland Kitchen lisinopril (PRINIVIL,ZESTRIL) 40 MG tablet Take 40 mg by mouth daily.      . Multiple Vitamin (MULTIVITAMIN) tablet Take 1 tablet by mouth daily.      . rosuvastatin (CRESTOR) 20 MG tablet Take 20 mg by mouth daily.      . SitaGLIPtin-MetFORMIN HCl (JANUMET PO) Take by mouth.      . naproxen sodium (ANAPROX) 220 MG tablet Take 220 mg by mouth 2 (two) times daily with a meal.       No current facility-administered medications for this visit.  not taking both statins  ROS: [x]  Positive   [ ]  Denies  General:[ ]  Weight loss,  [ ]  Weight gain, [ ]  Fever, [ ]  chills Neurologic: [ ]  Dizziness, [ ]  Blackouts, [ ]  Seizure [ ]  Stroke, [ ]  "Mini stroke", [ ]  Slurred speech, [ ]  Temporary blindness;  [ ] weakness, [ ]  Hoarseness Cardiac: [ ]  Chest pain/pressure, [ ]  Shortness of breath at rest [ ]  Shortness of breath with exertion,  [ ]   Atrial fibrillation or irregular heartbeat Vascular:[ ]  Pain in legs with walking, [ ]  Pain in legs at rest ,[ ]   Pain in legs at night,  [ ]   Non-healing ulcer, [ ]  Blood clot in vein/DVT,   Pulmonary: [ ]  Home oxygen, [ ]   Productive cough, [ ]  Coughing up blood,  [ ]  Asthma,  [ ]  Wheezing Musculoskeletal:  [ ]  Arthritis, [ ]  Low back pain,  [ ]  Joint pain Hematologic:[ ]  Easy Bruising, [ ]  Anemia; [ ]  Hepatitis Gastrointestinal: [ ]  Blood in stool,  [ ]  Gastroesophageal Reflux, [ ]  Trouble swallowing Urinary: [ ]  chronic Kidney disease, [ ]  on HD, [ ]  Burning with urination, [ ]  Frequent urination, [ ]  Difficulty urinating;  Skin: [ ]  Rashes, [ ]  Wounds     Physical Examination  Filed Vitals:   06/22/13 1148  BP: 155/84  Pulse: 74  Resp: 16   Filed Weights   06/22/13 1148  Weight: 152 lb (68.947 kg)   Body mass index is 25.7 kg/(m^2).  General: A&O x 3, WDWN,   Gait: normal Eyes: PERRLA, Pulmonary: CTAB, without wheezes , rales or rhonchi. Occasional moist cough. Breath sounds in all lung fields are diminished. Cardiac: regular Rythm , without murmur          Carotid Bruits Left Right   Negative Negative  Aorta: palpable Radial pulses: 2+ and equal                           VASCULAR EXAM: Extremities without ischemic changes  without Gangrene; without open wounds.                                                                                                         LE Pulses LEFT RIGHT       FEMORAL   palpable   palpable        POPLITEAL  not palpable   not palpable       POSTERIOR TIBIAL   palpable    palpable        DORSALIS PEDIS      ANTERIOR TIBIAL  palpable   palpable    Abdomen: soft, NT, no masses. Skin: no rashes, no ulcers noted. Musculoskeletal: no muscle wasting or atrophy.  Neurologic: A&O X 3; Appropriate Affect ; SENSATION: normal; MOTOR FUNCTION:  moving all extremities equally, motor strength 5/5 throughout. Speech is fluent/normal. CN 2-12 intact.   Non-Invasive Vascular Imaging: DATE: 06/22/2013 ABI: RIGHT 1.06, Waveforms: B;  LEFT 0.98. Previous ABI's (06/09/12): Right: 1.05, Left: 1.05. DUPLEX SCAN OF BYPASS: patent RLE bypass graft without evidence of restenosis, no change since 06/09/12.  ASSESSMENT: Allison [name] is a 72 y.o. female who presents status post right femoropopliteal artery bypass in 2011.  Duplex demonstrates patent RLE graft. ABI's demonstrate no RLE arterial occlusive disease and slight decrease in left ABI's from none to mild arterial occlusive disease. Her greatest risk factor for PAD remains her smoking, other risk factors for PAD seem controlled.  PLAN:  The patient was counseled re smoking cessation. I discussed in depth with the patient the nature of atherosclerosis, and emphasized the  importance of maximal medical management including strict control of blood pressure, blood  glucose, and lipid levels, obtaining regular exercise, and cessation of smoking.  The patient is aware that without maximal medical management the underlying atherosclerotic disease process will progress, limiting the benefit of any interventions. Based on the patient's vascular studies and examination, pt will return to clinic in 1 year for ABI's.  The patient was given information about PAD including signs, symptoms, treatment, what symptoms should prompt the patient to seek immediate medical care, and risk reduction measures to take. The patient was counseled re smoking cessation and given printed information re same.  Charisse March, RN, MSN, FNP-C [facility] Phone: [phone]  Clinic MD: Myra Gianotti  06/22/2013 12:26 PM

## 2013-06-22 NOTE — Patient Instructions (Signed)
Peripheral Vascular Disease Peripheral Vascular Disease (PVD), also called Peripheral Arterial Disease (PAD), is a circulation problem caused by cholesterol (atherosclerotic plaque) deposits in the arteries. PVD commonly occurs in the lower extremities (legs) but it can occur in other areas of the body, such as your arms. The cholesterol buildup in the arteries reduces blood flow which can cause pain and other serious problems. The presence of PVD can place a person at risk for Coronary Artery Disease (CAD).  CAUSES  Causes of PVD can be many. It is usually associated with more than one risk factor such as:   High Cholesterol.  Smoking.  Diabetes.  Lack of exercise or inactivity.  High blood pressure (hypertension).  Obesity.  Family history. SYMPTOMS   When the lower extremities are affected, patients with PVD may experience:  Leg pain with exertion or physical activity. This is called INTERMITTENT CLAUDICATION. This may present as cramping or numbness with physical activity. The location of the pain is associated with the level of blockage. For example, blockage at the abdominal level (distal abdominal aorta) may result in buttock or hip pain. Lower leg arterial blockage may result in calf pain.  As PVD becomes more severe, pain can develop with less physical activity.  In people with severe PVD, leg pain may occur at rest.  Other PVD signs and symptoms:  Leg numbness or weakness.  Coldness in the affected leg or foot, especially when compared to the other leg.  A change in leg color.  Patients with significant PVD are more prone to ulcers or sores on toes, feet or legs. These may take longer to heal or may reoccur. The ulcers or sores can become infected.  If signs and symptoms of PVD are ignored, gangrene may occur. This can result in the loss of toes or loss of an entire limb.  Not all leg pain is related to PVD. Other medical conditions can cause leg pain such  as:  Blood clots (embolism) or Deep Vein Thrombosis.  Inflammation of the blood vessels (vasculitis).  Spinal stenosis. DIAGNOSIS  Diagnosis of PVD can involve several different types of tests. These can include:  Pulse Volume Recording Method (PVR). This test is simple, painless and does not involve the use of X-rays. PVR involves measuring and comparing the blood pressure in the arms and legs. An ABI (Ankle-Brachial Index) is calculated. The normal ratio of blood pressures is 1. As this number becomes smaller, it indicates more severe disease.  < 0.95  indicates significant narrowing in one or more leg vessels.  <0.8 there will usually be pain in the foot, leg or buttock with exercise.  <0.4 will usually have pain in the legs at rest.  <0.25  usually indicates limb threatening PVD.  Doppler detection of pulses in the legs. This test is painless and checks to see if you have a pulses in your legs/feet.  A dye or contrast material (a substance that highlights the blood vessels so they show up on x-ray) may be given to help your caregiver better see the arteries for the following tests. The dye is eliminated from your body by the kidney's. Your caregiver may order blood work to check your kidney function and other laboratory values before the following tests are performed:  Magnetic Resonance Angiography (MRA). An MRA is a picture study of the blood vessels and arteries. The MRA machine uses a large magnet to produce images of the blood vessels.  Computed Tomography Angiography (CTA). A CTA is a   specialized x-ray that looks at how the blood flows in your blood vessels. An IV may be inserted into your arm so contrast dye can be injected.  Angiogram. Is a procedure that uses x-rays to look at your blood vessels. This procedure is minimally invasive, meaning a small incision (cut) is made in your groin. A small tube (catheter) is then inserted into the artery of your groin. The catheter is  guided to the blood vessel or artery your caregiver wants to examine. Contrast dye is injected into the catheter. X-rays are then taken of the blood vessel or artery. After the images are obtained, the catheter is taken out. TREATMENT  Treatment of PVD involves many interventions which may include:  Lifestyle changes:  Quitting smoking.  Exercise.  Following a low fat, low cholesterol diet.  Control of diabetes.  Foot care is very important to the PVD patient. Good foot care can help prevent infection.  Medication:  Cholesterol-lowering medicine.  Blood pressure medicine.  Anti-platelet drugs.  Certain medicines may reduce symptoms of Intermittent Claudication.  Interventional/Surgical options:  Angioplasty. An Angioplasty is a procedure that inflates a balloon in the blocked artery. This opens the blocked artery to improve blood flow.  Stent Implant. A wire mesh tube (stent) is placed in the artery. The stent expands and stays in place, allowing the artery to remain open.  Peripheral Bypass Surgery. This is a surgical procedure that reroutes the blood around a blocked artery to help improve blood flow. This type of procedure may be performed if Angioplasty or stent implants are not an option. SEEK IMMEDIATE MEDICAL CARE IF:   You develop pain or numbness in your arms or legs.  Your arm or leg turns cold, becomes blue in color.  You develop redness, warmth, swelling and pain in your arms or legs. MAKE SURE YOU:   Understand these instructions.  Will watch your condition.  Will get help right away if you are not doing well or get worse. Document Released: 09/18/2004 Document Revised: 11/03/2011 Document Reviewed: 08/15/2008 ExitCare Patient Information 2014 [employer].   Smoking Cessation Quitting smoking is important to your health and has many advantages. However, it is not always easy to quit since nicotine is a very addictive drug. Often times, people try 3  times or more before being able to quit. This document explains the best ways for you to prepare to quit smoking. Quitting takes hard work and a lot of effort, but you can do it. ADVANTAGES OF QUITTING SMOKING  You will live longer, feel better, and live better.  Your body will feel the impact of quitting smoking almost immediately.  Within 20 minutes, blood pressure decreases. Your pulse returns to its normal level.  After 8 hours, carbon monoxide levels in the blood return to normal. Your oxygen level increases.  After 24 hours, the chance of having a heart attack starts to decrease. Your breath, hair, and body stop smelling like smoke.  After 48 hours, damaged nerve endings begin to recover. Your sense of taste and smell improve.  After 72 hours, the body is virtually free of nicotine. Your bronchial tubes relax and breathing becomes easier.  After 2 to 12 weeks, lungs can hold more air. Exercise becomes easier and circulation improves.  The risk of having a heart attack, stroke, cancer, or lung disease is greatly reduced.  After 1 year, the risk of coronary heart disease is cut in half.  After 5 years, the risk of stroke falls to   the same as a nonsmoker.  After 10 years, the risk of lung cancer is cut in half and the risk of other cancers decreases significantly.  After 15 years, the risk of coronary heart disease drops, usually to the level of a nonsmoker.  If you are pregnant, quitting smoking will improve your chances of having a healthy baby.  The people you live with, especially any children, will be healthier.  You will have extra money to spend on things other than cigarettes. QUESTIONS TO THINK ABOUT BEFORE ATTEMPTING TO QUIT You may want to talk about your answers with your caregiver.  Why do you want to quit?  If you tried to quit in the past, what helped and what did not?  What will be the most difficult situations for you after you quit? How will you plan to  handle them?  Who can help you through the tough times? Your family? Friends? A caregiver?  What pleasures do you get from smoking? What ways can you still get pleasure if you quit? Here are some questions to ask your caregiver:  How can you help me to be successful at quitting?  What medicine do you think would be best for me and how should I take it?  What should I do if I need more help?  What is smoking withdrawal like? How can I get information on withdrawal? GET READY  Set a quit date.  Change your environment by getting rid of all cigarettes, ashtrays, matches, and lighters in your home, car, or work. Do not let people smoke in your home.  Review your past attempts to quit. Think about what worked and what did not. GET SUPPORT AND ENCOURAGEMENT You have a better chance of being successful if you have help. You can get support in many ways.  Tell your family, friends, and co-workers that you are going to quit and need their support. Ask them not to smoke around you.  Get individual, group, or telephone counseling and support. Programs are available at local hospitals and health centers. Call your local health department for information about programs in your area.  Spiritual beliefs and practices may help some smokers quit.  Download a "quit meter" on your computer to keep track of quit statistics, such as how long you have gone without smoking, cigarettes not smoked, and money saved.  Get a self-help book about quitting smoking and staying off of tobacco. LEARN NEW SKILLS AND BEHAVIORS  Distract yourself from urges to smoke. Talk to someone, go for a walk, or occupy your time with a task.  Change your normal routine. Take a different route to work. Drink tea instead of coffee. Eat breakfast in a different place.  Reduce your stress. Take a hot bath, exercise, or read a book.  Plan something enjoyable to do every day. Reward yourself for not smoking.  Explore  interactive web-based programs that specialize in helping you quit. GET MEDICINE AND USE IT CORRECTLY Medicines can help you stop smoking and decrease the urge to smoke. Combining medicine with the above behavioral methods and support can greatly increase your chances of successfully quitting smoking.  Nicotine replacement therapy helps deliver nicotine to your body without the negative effects and risks of smoking. Nicotine replacement therapy includes nicotine gum, lozenges, inhalers, nasal sprays, and skin patches. Some may be available over-the-counter and others require a prescription.  Antidepressant medicine helps people abstain from smoking, but how this works is unknown. This medicine is available by prescription.    Nicotinic receptor partial agonist medicine simulates the effect of nicotine in your brain. This medicine is available by prescription. Ask your caregiver for advice about which medicines to use and how to use them based on your health history. Your caregiver will tell you what side effects to look out for if you choose to be on a medicine or therapy. Carefully read the information on the package. Do not use any other product containing nicotine while using a nicotine replacement product.  RELAPSE OR DIFFICULT SITUATIONS Most relapses occur within the first 3 months after quitting. Do not be discouraged if you start smoking again. Remember, most people try several times before finally quitting. You may have symptoms of withdrawal because your body is used to nicotine. You may crave cigarettes, be irritable, feel very hungry, cough often, get headaches, or have difficulty concentrating. The withdrawal symptoms are only temporary. They are strongest when you first quit, but they will go away within 10 14 days. To reduce the chances of relapse, try to:  Avoid drinking alcohol. Drinking lowers your chances of successfully quitting.  Reduce the amount of caffeine you consume. Once you  quit smoking, the amount of caffeine in your body increases and can give you symptoms, such as a rapid heartbeat, sweating, and anxiety.  Avoid smokers because they can make you want to smoke.  Do not let weight gain distract you. Many smokers will gain weight when they quit, usually less than 10 pounds. Eat a healthy diet and stay active. You can always lose the weight gained after you quit.  Find ways to improve your mood other than smoking. FOR MORE INFORMATION  www.smokefree.gov  Document Released: 08/05/2001 Document Revised: 02/10/2012 Document Reviewed: 11/20/2011 [employer] Patient Information 2014 [employer].  

## 2014-06-16 ENCOUNTER — Other Ambulatory Visit: Payer: Self-pay | Admitting: *Deleted

## 2014-06-16 DIAGNOSIS — I739 Peripheral vascular disease, unspecified: Secondary | ICD-10-CM

## 2014-06-16 DIAGNOSIS — Z48812 Encounter for surgical aftercare following surgery on the circulatory system: Secondary | ICD-10-CM

## 2014-06-26 ENCOUNTER — Encounter: Payer: Self-pay | Admitting: Family

## 2014-06-27 ENCOUNTER — Ambulatory Visit (INDEPENDENT_AMBULATORY_CARE_PROVIDER_SITE_OTHER): Payer: Commercial Managed Care - HMO | Admitting: Family

## 2014-06-27 ENCOUNTER — Ambulatory Visit (INDEPENDENT_AMBULATORY_CARE_PROVIDER_SITE_OTHER)
Admission: RE | Admit: 2014-06-27 | Discharge: 2014-06-27 | Disposition: A | Discharge status: Home / Self Care | Payer: Commercial Managed Care - HMO | Source: Ambulatory Visit | Attending: Family | Admitting: Family

## 2014-06-27 ENCOUNTER — Ambulatory Visit (HOSPITAL_COMMUNITY)
Admission: RE | Admit: 2014-06-27 | Discharge: 2014-06-27 | Disposition: A | Discharge status: Home / Self Care | Payer: Commercial Managed Care - HMO | Source: Ambulatory Visit | Attending: Family | Admitting: Family

## 2014-06-27 ENCOUNTER — Encounter: Payer: Self-pay | Admitting: Family

## 2014-06-27 VITALS — BP 153/86 | HR 76 | Resp 16 | Ht 64.5 in | Wt 154.0 lb

## 2014-06-27 DIAGNOSIS — I739 Peripheral vascular disease, unspecified: Secondary | ICD-10-CM | POA: Diagnosis present

## 2014-06-27 DIAGNOSIS — Z48812 Encounter for surgical aftercare following surgery on the circulatory system: Secondary | ICD-10-CM

## 2014-06-27 NOTE — Patient Instructions (Addendum)
Peripheral Vascular Disease Peripheral Vascular Disease (PVD), also called Peripheral Arterial Disease (PAD), is a circulation problem caused by cholesterol (atherosclerotic plaque) deposits in the arteries. PVD commonly occurs in the lower extremities (legs) but it can occur in other areas of the body, such as your arms. The cholesterol buildup in the arteries reduces blood flow which can cause pain and other serious problems. The presence of PVD can place a person at risk for Coronary Artery Disease (CAD).  CAUSES  Causes of PVD can be many. It is usually associated with more than one risk factor such as:   High Cholesterol.  Smoking.  Diabetes.  Lack of exercise or inactivity.  High blood pressure (hypertension).  Obesity.  Family history. SYMPTOMS   When the lower extremities are affected, patients with PVD may experience:  Leg pain with exertion or physical activity. This is called INTERMITTENT CLAUDICATION. This may present as cramping or numbness with physical activity. The location of the pain is associated with the level of blockage. For example, blockage at the abdominal level (distal abdominal aorta) may result in buttock or hip pain. Lower leg arterial blockage may result in calf pain.  As PVD becomes more severe, pain can develop with less physical activity.  In people with severe PVD, leg pain may occur at rest.  Other PVD signs and symptoms:  Leg numbness or weakness.  Coldness in the affected leg or foot, especially when compared to the other leg.  A change in leg color.  Patients with significant PVD are more prone to ulcers or sores on toes, feet or legs. These may take longer to heal or may reoccur. The ulcers or sores can become infected.  If signs and symptoms of PVD are ignored, gangrene may occur. This can result in the loss of toes or loss of an entire limb.  Not all leg pain is related to PVD. Other medical conditions can cause leg pain such  as:  Blood clots (embolism) or Deep Vein Thrombosis.  Inflammation of the blood vessels (vasculitis).  Spinal stenosis. DIAGNOSIS  Diagnosis of PVD can involve several different types of tests. These can include:  Pulse Volume Recording Method (PVR). This test is simple, painless and does not involve the use of X-rays. PVR involves measuring and comparing the blood pressure in the arms and legs. An ABI (Ankle-Brachial Index) is calculated. The normal ratio of blood pressures is 1. As this number becomes smaller, it indicates more severe disease.  < 0.95 - indicates significant narrowing in one or more leg vessels.  <0.8 - there will usually be pain in the foot, leg or buttock with exercise.  <0.4 - will usually have pain in the legs at rest.  <0.25 - usually indicates limb threatening PVD.  Doppler detection of pulses in the legs. This test is painless and checks to see if you have a pulses in your legs/feet.  A dye or contrast material (a substance that highlights the blood vessels so they show up on x-ray) may be given to help your caregiver better see the arteries for the following tests. The dye is eliminated from your body by the kidney's. Your caregiver may order blood work to check your kidney function and other laboratory values before the following tests are performed:  Magnetic Resonance Angiography (MRA). An MRA is a picture study of the blood vessels and arteries. The MRA machine uses a large magnet to produce images of the blood vessels.  Computed Tomography Angiography (CTA). A CTA   is a specialized x-ray that looks at how the blood flows in your blood vessels. An IV may be inserted into your arm so contrast dye can be injected.  Angiogram. Is a procedure that uses x-rays to look at your blood vessels. This procedure is minimally invasive, meaning a small incision (cut) is made in your groin. A small tube (catheter) is then inserted into the artery of your groin. The catheter  is guided to the blood vessel or artery your caregiver wants to examine. Contrast dye is injected into the catheter. X-rays are then taken of the blood vessel or artery. After the images are obtained, the catheter is taken out. TREATMENT  Treatment of PVD involves many interventions which may include:  Lifestyle changes:  Quitting smoking.  Exercise.  Following a low fat, low cholesterol diet.  Control of diabetes.  Foot care is very important to the PVD patient. Good foot care can help prevent infection.  Medication:  Cholesterol-lowering medicine.  Blood pressure medicine.  Anti-platelet drugs.  Certain medicines may reduce symptoms of Intermittent Claudication.  Interventional/Surgical options:  Angioplasty. An Angioplasty is a procedure that inflates a balloon in the blocked artery. This opens the blocked artery to improve blood flow.  Stent Implant. A wire mesh tube (stent) is placed in the artery. The stent expands and stays in place, allowing the artery to remain open.  Peripheral Bypass Surgery. This is a surgical procedure that reroutes the blood around a blocked artery to help improve blood flow. This type of procedure may be performed if Angioplasty or stent implants are not an option. SEEK IMMEDIATE MEDICAL CARE IF:   You develop pain or numbness in your arms or legs.  Your arm or leg turns cold, becomes blue in color.  You develop redness, warmth, swelling and pain in your arms or legs. MAKE SURE YOU:   Understand these instructions.  Will watch your condition.  Will get help right away if you are not doing well or get worse. Document Released: 09/18/2004 Document Revised: 11/03/2011 Document Reviewed: 08/15/2008 ExitCare Patient Information 2015 [employer]. This information is not intended to replace advice given to you by your health care provider. Make sure you discuss any questions you have with your health care provider.   Smoking  Cessation Quitting smoking is important to your health and has many advantages. However, it is not always easy to quit since nicotine is a very addictive drug. Oftentimes, people try 3 times or more before being able to quit. This document explains the best ways for you to prepare to quit smoking. Quitting takes hard work and a lot of effort, but you can do it. ADVANTAGES OF QUITTING SMOKING  You will live longer, feel better, and live better.  Your body will feel the impact of quitting smoking almost immediately.  Within 20 minutes, blood pressure decreases. Your pulse returns to its normal level.  After 8 hours, carbon monoxide levels in the blood return to normal. Your oxygen level increases.  After 24 hours, the chance of having a heart attack starts to decrease. Your breath, hair, and body stop smelling like smoke.  After 48 hours, damaged nerve endings begin to recover. Your sense of taste and smell improve.  After 72 hours, the body is virtually free of nicotine. Your bronchial tubes relax and breathing becomes easier.  After 2 to 12 weeks, lungs can hold more air. Exercise becomes easier and circulation improves.  The risk of having a heart attack, stroke, cancer,   or lung disease is greatly reduced.  After 1 year, the risk of coronary heart disease is cut in half.  After 5 years, the risk of stroke falls to the same as a nonsmoker.  After 10 years, the risk of lung cancer is cut in half and the risk of other cancers decreases significantly.  After 15 years, the risk of coronary heart disease drops, usually to the level of a nonsmoker.  If you are pregnant, quitting smoking will improve your chances of having a healthy baby.  The people you live with, especially any children, will be healthier.  You will have extra money to spend on things other than cigarettes. QUESTIONS TO THINK ABOUT BEFORE ATTEMPTING TO QUIT You may want to talk about your answers with your health care  provider.  Why do you want to quit?  If you tried to quit in the past, what helped and what did not?  What will be the most difficult situations for you after you quit? How will you plan to handle them?  Who can help you through the tough times? Your family? Friends? A health care provider?  What pleasures do you get from smoking? What ways can you still get pleasure if you quit? Here are some questions to ask your health care provider:  How can you help me to be successful at quitting?  What medicine do you think would be best for me and how should I take it?  What should I do if I need more help?  What is smoking withdrawal like? How can I get information on withdrawal? GET READY  Set a quit date.  Change your environment by getting rid of all cigarettes, ashtrays, matches, and lighters in your home, car, or work. Do not let people smoke in your home.  Review your past attempts to quit. Think about what worked and what did not. GET SUPPORT AND ENCOURAGEMENT You have a better chance of being successful if you have help. You can get support in many ways.  Tell your family, friends, and coworkers that you are going to quit and need their support. Ask them not to smoke around you.  Get individual, group, or telephone counseling and support. Programs are available at local hospitals and health centers. Call your local health department for information about programs in your area.  Spiritual beliefs and practices may help some smokers quit.  Download a "quit meter" on your computer to keep track of quit statistics, such as how long you have gone without smoking, cigarettes not smoked, and money saved.  Get a self-help book about quitting smoking and staying off tobacco. LEARN NEW SKILLS AND BEHAVIORS  Distract yourself from urges to smoke. Talk to someone, go for a walk, or occupy your time with a task.  Change your normal routine. Take a different route to work. Drink tea  instead of coffee. Eat breakfast in a different place.  Reduce your stress. Take a hot bath, exercise, or read a book.  Plan something enjoyable to do every day. Reward yourself for not smoking.  Explore interactive web-based programs that specialize in helping you quit. GET MEDICINE AND USE IT CORRECTLY Medicines can help you stop smoking and decrease the urge to smoke. Combining medicine with the above behavioral methods and support can greatly increase your chances of successfully quitting smoking.  Nicotine replacement therapy helps deliver nicotine to your body without the negative effects and risks of smoking. Nicotine replacement therapy includes nicotine gum, lozenges, inhalers,   nasal sprays, and skin patches. Some may be available over-the-counter and others require a prescription.  Antidepressant medicine helps people abstain from smoking, but how this works is unknown. This medicine is available by prescription.  Nicotinic receptor partial agonist medicine simulates the effect of nicotine in your brain. This medicine is available by prescription. Ask your health care provider for advice about which medicines to use and how to use them based on your health history. Your health care provider will tell you what side effects to look out for if you choose to be on a medicine or therapy. Carefully read the information on the package. Do not use any other product containing nicotine while using a nicotine replacement product.  RELAPSE OR DIFFICULT SITUATIONS Most relapses occur within the first 3 months after quitting. Do not be discouraged if you start smoking again. Remember, most people try several times before finally quitting. You may have symptoms of withdrawal because your body is used to nicotine. You may crave cigarettes, be irritable, feel very hungry, cough often, get headaches, or have difficulty concentrating. The withdrawal symptoms are only temporary. They are strongest when you  first quit, but they will go away within 10-14 days. To reduce the chances of relapse, try to:  Avoid drinking alcohol. Drinking lowers your chances of successfully quitting.  Reduce the amount of caffeine you consume. Once you quit smoking, the amount of caffeine in your body increases and can give you symptoms, such as a rapid heartbeat, sweating, and anxiety.  Avoid smokers because they can make you want to smoke.  Do not let weight gain distract you. Many smokers will gain weight when they quit, usually less than 10 pounds. Eat a healthy diet and stay active. You can always lose the weight gained after you quit.  Find ways to improve your mood other than smoking. FOR MORE INFORMATION  www.smokefree.gov  Document Released: 08/05/2001 Document Revised: 12/26/2013 Document Reviewed: 11/20/2011 ExitCare Patient Information 2015 [employer]. This information is not intended to replace advice given to you by your health care provider. Make sure you discuss any questions you have with your health care provider.  

## 2014-06-27 NOTE — Progress Notes (Signed)
[facility] HISTORY AND PHYSICAL -PAD  History of Present Illness Allison [name] is a 73 y.o. female  patient of Dr. Trula Slade who is status post a right femoropopliteal artery bypass in 2011.  She returns today for Duplex surveillance of RLE. Has not yet eaten nor taken any of her morning medications including antihypertensives.   Is not walking much but denies any claudication symptoms; does have a treadmill and stationary bike, is using her stationary bike 3x/week for 15 minutes, walks 30 minutes 3x/week. States her PCP is working with her re her smoking; states he has gone through a smoking cessation program, used nicotine patches and gum, none have worked for her.  Pt. denies rest pain; denies night pain denies non healing ulcers on legs/feet, denies cardiac problems.  Patient denies New Medical or Surgical History.   Pt Diabetic: Yes, states in good control Pt smoker: smoker (0-3 cigarettes/day, x 60 yrs)  Pt meds include: Statin :Yes ASA: Yes Other anticoagulants/antiplatelets: no     Past Medical History  Diagnosis Date  . Peripheral vascular disease   . Arthritis   . Hypertension   . Hyperlipidemia   . 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  . CAD (coronary artery disease)     Social History History  Substance Use Topics  . Smoking status: Current Some Day Smoker  . Smokeless tobacco: Never Used  . Alcohol Use: No    Family History Family History  Problem Relation Age of Onset  . Family history unknown: Yes    Past Surgical History  Procedure Laterality Date  . Pr vein bypass graft,aorto-fem-pop  04-25-2010    Right Fem-Pop BPG  . Abdominal hysterectomy    . Appendectomy  2006  . Bone spur removal      Both feet    Allergies  Allergen Reactions  . Cefaclor   . Minocycline Hcl   . Nitrofurantoin   . Sucralfate     Current Outpatient Prescriptions  Medication Sig Dispense Refill  .  Amlodipine-Olmesartan (AZOR PO) Take by mouth.    Marland Kitchen aspirin 81 MG tablet Take 81 mg by mouth daily.    Marland Kitchen atorvastatin (LIPITOR) 40 MG tablet daily.    . hydrochlorothiazide (HYDRODIURIL) 25 MG tablet Take 25 mg by mouth daily.    Marland Kitchen lisinopril (PRINIVIL,ZESTRIL) 40 MG tablet Take 40 mg by mouth daily.    . Multiple Vitamin (MULTIVITAMIN) tablet Take 1 tablet by mouth daily.    . naproxen sodium (ANAPROX) 220 MG tablet Take 220 mg by mouth 2 (two) times daily with a meal.    . rosuvastatin (CRESTOR) 20 MG tablet Take 20 mg by mouth daily.    . SitaGLIPtin-MetFORMIN HCl (JANUMET PO) Take by mouth.     No current facility-administered medications for this visit.    ROS: See HPI for pertinent positives and negatives.   Physical Examination  Filed Vitals:   06/27/14 0945  BP: 153/86  Pulse: 76  Resp: 16  Height: 5' 4.5" (1.638 m)  Weight: 154 lb (69.854 kg)  SpO2: 98%   Body mass index is 26.04 kg/(m^2).  General: A&O x 3, WDWN,  Gait: normal Eyes: PERRLA, Pulmonary: CTAB, without wheezes , rales or rhonchi. Occasional moist cough. Breath sounds in all lung fields are diminished. Cardiac: regular Rythm , without murmur     Carotid Bruits Left Right   Negative Negative  Aorta: not palpable Radial pulses: 2+ and equal  VASCULAR EXAM: Extremities without ischemic changes  without Gangrene; without open wounds.     LE Pulses LEFT RIGHT   FEMORAL  palpable  palpable    POPLITEAL not palpable  not palpable   POSTERIOR TIBIAL  not palpable   palpable    DORSALIS PEDIS  ANTERIOR TIBIAL palpable  palpable    Abdomen: soft, NT, no palpated masses. Skin: no rashes, no ulcers noted. Musculoskeletal: no muscle wasting or atrophy. Neurologic: A&O  X 3; Appropriate Affect ; SENSATION: normal; MOTOR FUNCTION: moving all extremities equally, motor strength 5/5 throughout. Speech is fluent/normal. CN 2-12 intact.    Non-Invasive Vascular Imaging: DATE: 06/27/2014 LOWER EXTREMITY ARTERIAL DUPLEX EVALUATION    INDICATION: Peripheral vascular disease     PREVIOUS INTERVENTION(S): Right femoropopliteal bypass graft 04/25/2010.    DUPLEX EXAM:     RIGHT  LEFT   Peak Systolic Velocity (cm/s) Ratio (if abnormal) Waveform  Peak Systolic Velocity (cm/s) Ratio (if abnormal) Waveform  157  T  Inflow Artery     134  T Proximal Anastomosis     177  T Proximal Graft     79  T Mid Graft     64  T  Distal Graft     72  T Distal Anastomosis     69  T Outflow Artery     1.08 Today's ABI / TBI 1.08  1.06 Previous ABI / TBI (06/22/2013  ) 0.98    Waveform:    M - Monophasic       B - Biphasic       T - Triphasic  If Ankle Brachial Index (ABI) or Toe Brachial Index (TBI) performed, please see complete report     ADDITIONAL FINDINGS:     IMPRESSION: Widely patent right femoropopliteal bypass graft without evidence of stenosis.    Compared to the previous exam:  No significant change in comparison to the last exam on 06/22/2013.     ASSESSMENT: Allison [name] is a 73 y.o. female who is status post right femoropopliteal artery bypass in 2011. Does not walk much although she does not seem to have any barriers to walking, has no claudication symptoms and no tissue loss. Right LE arterial Duplex today reveals a widely patent right femoropopliteal bypass graft without evidence of stenosis, no significant change in comparison to the last exam on 06/22/2013.   PLAN:  Graduated walking program. The patient was counseled re smoking cessation and given several free resources re smoking cessation.  I discussed in depth with the patient the nature of atherosclerosis, and emphasized the importance of maximal medical management including strict control  of blood pressure, blood glucose, and lipid levels, obtaining regular exercise, and cessation of smoking.  The patient is aware that without maximal medical management the underlying atherosclerotic disease process will progress, limiting the benefit of any interventions.  Based on the patient's vascular studies and examination, pt will return to clinic in 1 year for ABI's and right LE arterial Duplex.   The patient was given information about PAD including signs, symptoms, treatment, what symptoms should prompt the patient to seek immediate medical care, and risk reduction measures to take.  Clemon Chambers, RN, MSN, FNP-C [facility] Phone: [phone]  Clinic MD: Kellie Simmering  06/27/2014 10:01 AM

## 2014-08-31 DIAGNOSIS — H25011 Cortical age-related cataract, right eye: Secondary | ICD-10-CM | POA: Diagnosis not present

## 2014-08-31 DIAGNOSIS — H2513 Age-related nuclear cataract, bilateral: Secondary | ICD-10-CM | POA: Diagnosis not present

## 2014-08-31 DIAGNOSIS — E119 Type 2 diabetes mellitus without complications: Secondary | ICD-10-CM | POA: Diagnosis not present

## 2014-08-31 DIAGNOSIS — H2511 Age-related nuclear cataract, right eye: Secondary | ICD-10-CM | POA: Diagnosis not present

## 2014-08-31 DIAGNOSIS — H25013 Cortical age-related cataract, bilateral: Secondary | ICD-10-CM | POA: Diagnosis not present

## 2014-09-13 DIAGNOSIS — H2511 Age-related nuclear cataract, right eye: Secondary | ICD-10-CM | POA: Diagnosis not present

## 2014-09-13 DIAGNOSIS — H25012 Cortical age-related cataract, left eye: Secondary | ICD-10-CM | POA: Diagnosis not present

## 2014-09-13 DIAGNOSIS — H25011 Cortical age-related cataract, right eye: Secondary | ICD-10-CM | POA: Diagnosis not present

## 2014-09-13 DIAGNOSIS — H2512 Age-related nuclear cataract, left eye: Secondary | ICD-10-CM | POA: Diagnosis not present

## 2014-09-20 DIAGNOSIS — H2512 Age-related nuclear cataract, left eye: Secondary | ICD-10-CM | POA: Diagnosis not present

## 2014-09-20 DIAGNOSIS — H25012 Cortical age-related cataract, left eye: Secondary | ICD-10-CM | POA: Diagnosis not present

## 2014-09-26 DIAGNOSIS — I10 Essential (primary) hypertension: Secondary | ICD-10-CM | POA: Diagnosis not present

## 2014-09-26 DIAGNOSIS — F17210 Nicotine dependence, cigarettes, uncomplicated: Secondary | ICD-10-CM | POA: Diagnosis not present

## 2014-09-26 DIAGNOSIS — E1151 Type 2 diabetes mellitus with diabetic peripheral angiopathy without gangrene: Secondary | ICD-10-CM | POA: Diagnosis not present

## 2014-09-26 DIAGNOSIS — J440 Chronic obstructive pulmonary disease with acute lower respiratory infection: Secondary | ICD-10-CM | POA: Diagnosis not present

## 2014-09-26 DIAGNOSIS — I739 Peripheral vascular disease, unspecified: Secondary | ICD-10-CM | POA: Diagnosis not present

## 2014-09-26 DIAGNOSIS — E780 Pure hypercholesterolemia: Secondary | ICD-10-CM | POA: Diagnosis not present

## 2015-01-15 DIAGNOSIS — Z961 Presence of intraocular lens: Secondary | ICD-10-CM | POA: Diagnosis not present

## 2015-01-24 DIAGNOSIS — J449 Chronic obstructive pulmonary disease, unspecified: Secondary | ICD-10-CM | POA: Diagnosis not present

## 2015-01-24 DIAGNOSIS — E1151 Type 2 diabetes mellitus with diabetic peripheral angiopathy without gangrene: Secondary | ICD-10-CM | POA: Diagnosis not present

## 2015-01-24 DIAGNOSIS — F17210 Nicotine dependence, cigarettes, uncomplicated: Secondary | ICD-10-CM | POA: Diagnosis not present

## 2015-01-24 DIAGNOSIS — I739 Peripheral vascular disease, unspecified: Secondary | ICD-10-CM | POA: Diagnosis not present

## 2015-01-24 DIAGNOSIS — E780 Pure hypercholesterolemia: Secondary | ICD-10-CM | POA: Diagnosis not present

## 2015-01-24 DIAGNOSIS — I10 Essential (primary) hypertension: Secondary | ICD-10-CM | POA: Diagnosis not present

## 2015-06-06 DIAGNOSIS — I10 Essential (primary) hypertension: Secondary | ICD-10-CM | POA: Diagnosis not present

## 2015-06-06 DIAGNOSIS — F17210 Nicotine dependence, cigarettes, uncomplicated: Secondary | ICD-10-CM | POA: Diagnosis not present

## 2015-06-06 DIAGNOSIS — E1151 Type 2 diabetes mellitus with diabetic peripheral angiopathy without gangrene: Secondary | ICD-10-CM | POA: Diagnosis not present

## 2015-06-06 DIAGNOSIS — I739 Peripheral vascular disease, unspecified: Secondary | ICD-10-CM | POA: Diagnosis not present

## 2015-06-06 DIAGNOSIS — J441 Chronic obstructive pulmonary disease with (acute) exacerbation: Secondary | ICD-10-CM | POA: Diagnosis not present

## 2015-06-06 DIAGNOSIS — E7800 Pure hypercholesterolemia, unspecified: Secondary | ICD-10-CM | POA: Diagnosis not present

## 2015-07-03 ENCOUNTER — Ambulatory Visit: Payer: Commercial Managed Care - HMO | Admitting: Family

## 2015-07-03 ENCOUNTER — Encounter (HOSPITAL_COMMUNITY): Payer: Commercial Managed Care - HMO

## 2015-07-24 DIAGNOSIS — Z0001 Encounter for general adult medical examination with abnormal findings: Secondary | ICD-10-CM | POA: Diagnosis not present

## 2015-07-24 DIAGNOSIS — J449 Chronic obstructive pulmonary disease, unspecified: Secondary | ICD-10-CM | POA: Diagnosis not present

## 2015-07-24 DIAGNOSIS — E1151 Type 2 diabetes mellitus with diabetic peripheral angiopathy without gangrene: Secondary | ICD-10-CM | POA: Diagnosis not present

## 2015-07-24 DIAGNOSIS — I739 Peripheral vascular disease, unspecified: Secondary | ICD-10-CM | POA: Diagnosis not present

## 2015-07-24 DIAGNOSIS — F17210 Nicotine dependence, cigarettes, uncomplicated: Secondary | ICD-10-CM | POA: Diagnosis not present

## 2015-07-24 DIAGNOSIS — E7800 Pure hypercholesterolemia, unspecified: Secondary | ICD-10-CM | POA: Diagnosis not present

## 2015-07-24 DIAGNOSIS — Z1389 Encounter for screening for other disorder: Secondary | ICD-10-CM | POA: Diagnosis not present

## 2015-07-24 DIAGNOSIS — I10 Essential (primary) hypertension: Secondary | ICD-10-CM | POA: Diagnosis not present

## 2015-08-01 ENCOUNTER — Other Ambulatory Visit: Payer: Self-pay | Admitting: *Deleted

## 2015-08-01 DIAGNOSIS — I739 Peripheral vascular disease, unspecified: Secondary | ICD-10-CM

## 2015-08-01 DIAGNOSIS — Z48812 Encounter for surgical aftercare following surgery on the circulatory system: Secondary | ICD-10-CM

## 2015-08-02 ENCOUNTER — Encounter: Payer: Self-pay | Admitting: Family

## 2015-08-03 ENCOUNTER — Ambulatory Visit (INDEPENDENT_AMBULATORY_CARE_PROVIDER_SITE_OTHER): Payer: Commercial Managed Care - HMO | Admitting: Family

## 2015-08-03 ENCOUNTER — Ambulatory Visit (HOSPITAL_COMMUNITY)
Admission: RE | Admit: 2015-08-03 | Discharge: 2015-08-03 | Disposition: A | Discharge status: Home / Self Care | Payer: Commercial Managed Care - HMO | Source: Ambulatory Visit | Attending: Family | Admitting: Family

## 2015-08-03 ENCOUNTER — Ambulatory Visit (INDEPENDENT_AMBULATORY_CARE_PROVIDER_SITE_OTHER)
Admission: RE | Admit: 2015-08-03 | Discharge: 2015-08-03 | Disposition: A | Discharge status: Home / Self Care | Payer: Commercial Managed Care - HMO | Source: Ambulatory Visit | Attending: Family | Admitting: Family

## 2015-08-03 ENCOUNTER — Encounter: Payer: Self-pay | Admitting: Family

## 2015-08-03 VITALS — BP 111/67 | HR 87 | Ht 64.5 in | Wt 143.4 lb

## 2015-08-03 DIAGNOSIS — Z48812 Encounter for surgical aftercare following surgery on the circulatory system: Secondary | ICD-10-CM

## 2015-08-03 DIAGNOSIS — E119 Type 2 diabetes mellitus without complications: Secondary | ICD-10-CM | POA: Insufficient documentation

## 2015-08-03 DIAGNOSIS — I10 Essential (primary) hypertension: Secondary | ICD-10-CM | POA: Diagnosis not present

## 2015-08-03 DIAGNOSIS — I739 Peripheral vascular disease, unspecified: Secondary | ICD-10-CM

## 2015-08-03 DIAGNOSIS — Z720 Tobacco use: Secondary | ICD-10-CM | POA: Diagnosis not present

## 2015-08-03 DIAGNOSIS — Z95828 Presence of other vascular implants and grafts: Secondary | ICD-10-CM

## 2015-08-03 DIAGNOSIS — F17200 Nicotine dependence, unspecified, uncomplicated: Secondary | ICD-10-CM

## 2015-08-03 DIAGNOSIS — E785 Hyperlipidemia, unspecified: Secondary | ICD-10-CM | POA: Diagnosis not present

## 2015-08-03 NOTE — Progress Notes (Signed)
[facility] [place] HISTORY AND PHYSICAL -PAD  History of Present Illness Allison [name] is a 74 y.o. female patient of Dr. Trula Slade who is status post right femoropopliteal artery bypass in 2011.  She returns today for Duplex surveillance of RLE.  Is not walking much but denies any claudication symptoms; does have a treadmill and stationary bike, is no longer using her stationary bike 3x/week for 15 minutes, no longer walks 30 minutes 3x/week. States her PCP is working with her re her smoking; states she has gone through a smoking cessation program, used nicotine patches and gum, none have worked for her.  Pt. denies rest pain; denies night pain, denies non healing wounds.  denies non healing ulcers on legs/feet, denies cardiac problems.  Patient denies New Medical or Surgical History.   Pt Diabetic: Yes, states in good control Pt smoker: smoker (5-6 cigarettes/day, x 60 yrs)  Pt meds include: Statin :Yes ASA: Yes Other anticoagulants/antiplatelets: no    Past Medical History  Diagnosis Date  . Peripheral vascular disease ([place])   . Arthritis   . Hypertension   . Hyperlipidemia   . Diabetes mellitus without complication (HCC)     Type II  . CAD (coronary artery disease)     Social History Social History  Substance Use Topics  . Smoking status: Current Some Day Smoker -- 0.25 packs/day    Types: Cigarettes  . Smokeless tobacco: Never Used  . Alcohol Use: No    Family History Family History  Problem Relation Age of Onset  . Family history unknown: Yes    Past Surgical History  Procedure Laterality Date  . Pr vein bypass graft,aorto-fem-pop  04-25-2010    Right Fem-Pop BPG  . Abdominal hysterectomy    . Appendectomy  2006  . Bone spur removal      Both feet    Allergies  Allergen Reactions  . Cefaclor   . Minocycline Hcl   . Nitrofurantoin   . Sucralfate     Current Outpatient Prescriptions  Medication Sig Dispense Refill  .  aspirin 81 MG tablet Take 81 mg by mouth daily.    Marland Kitchen atorvastatin (LIPITOR) 40 MG tablet daily.    . hydrochlorothiazide (HYDRODIURIL) 25 MG tablet Take 25 mg by mouth daily.    Marland Kitchen JANUMET 50-500 MG tablet TK 1 T PO QAM WITH A MEAL  10  . lisinopril (PRINIVIL,ZESTRIL) 40 MG tablet Take 40 mg by mouth daily.    . Multiple Vitamin (MULTIVITAMIN) tablet Take 1 tablet by mouth daily.    . Amlodipine-Olmesartan (AZOR PO) Take by mouth.    . naproxen sodium (ANAPROX) 220 MG tablet Take 220 mg by mouth 2 (two) times daily with a meal.    . rosuvastatin (CRESTOR) 20 MG tablet Take 20 mg by mouth daily.     No current facility-administered medications for this visit.    ROS: See HPI for pertinent positives and negatives.   Physical Examination  Filed Vitals:   08/03/15 0929  BP: 111/67  Pulse: 87  Height: 5' 4.5" (1.638 m)  Weight: 143 lb 6.4 oz (65.046 kg)  SpO2: 97%   Body mass index is 24.24 kg/(m^2).  General: A&O x 3, WDWN,  Gait: normal Eyes: PERRLA, Pulmonary: CTAB, without wheezes , rales or rhonchi. Occasional moist cough. Breath sounds in all lung fields are diminished. Cardiac: regular rhythm, no detected murmur     Carotid Bruits Left Right   Negative Negative  Aorta: not palpable  Radial pulses: 2+ and equal   VASCULAR EXAM: Extremities without ischemic changes  without Gangrene; without open wounds.     LE Pulses LEFT RIGHT   FEMORAL  palpable  palpable    POPLITEAL not palpable  not palpable   POSTERIOR TIBIAL  not palpable   faintly palpable    DORSALIS PEDIS  ANTERIOR TIBIAL not palpable  palpable    Abdomen: soft, NT, no palpated masses. Skin: no rashes, no ulcers. Musculoskeletal: no muscle wasting or  atrophy. Neurologic: A&O X 3; Appropriate Affect; MOTOR FUNCTION: moving all extremities equally, motor strength 5/5 throughout. Speech is fluent/normal. CN 2-12 intact.           Non-Invasive Vascular Imaging: DATE: 08/03/2015 ABI: RIGHT: 1.18 (1.08, 06/27/14), Waveforms: triphasic, TBI: 0.61;  LEFT: 0.80 (1.08), Waveforms: monophasic, TBI: absent DUPLEX SCAN OF BYPASS: Widely patent graft with no restenosis.  ASSESSMENT: Allison [name] is a 74 y.o. female who is status post right femoropopliteal artery bypass in 2011.  She does not walk enough to elicit claudication, has no signs of ischemia in her feet/legs. She is not trying to walk in a graduated walking program. Right leg bypass duplex suggests a widely patent graft with no restenosis.  Right leg ABI remains normal with triphasic waveforms; left ABI has decreased from normal to mild arterial occlusive disease.   Her atherosclerotic risk factors include controlled DM and continued smoking.    PLAN:  Graduated walking program discussed in detail. The patient was counseled re smoking cessation and given several free resources re smoking cessation.  Based on the patient's vascular studies and examination, pt will return to clinic in 1 year with ABI's and right LE arterial duplex. She knows to returns sooner for claudication pain or non healing wounds.   I discussed in depth with the patient the nature of atherosclerosis, and emphasized the importance of maximal medical management including strict control of blood pressure, blood glucose, and lipid levels, obtaining regular exercise, and cessation of smoking.  The patient is aware that without maximal medical management the underlying atherosclerotic disease process will progress, limiting the benefit of any interventions.  The patient was given information about PAD including signs, symptoms, treatment, what symptoms should prompt the patient to seek immediate medical care,  and risk reduction measures to take.  Clemon Chambers, RN, MSN, FNP-C [facility] Phone: [phone]  Clinic MD: Trula Slade  08/03/2015 9:38 AM

## 2015-08-03 NOTE — Patient Instructions (Addendum)
Peripheral Vascular Disease Peripheral vascular disease (PVD) is a disease of the blood vessels that are not part of your heart and brain. A simple term for PVD is poor circulation. In most cases, PVD narrows the blood vessels that carry blood from your heart to the rest of your body. This can result in a decreased supply of blood to your arms, legs, and internal organs, like your stomach or kidneys. However, it most often affects a person's lower legs and feet. There are two types of PVD.  Organic PVD. This is the more common type. It is caused by damage to the structure of blood vessels.  Functional PVD. This is caused by conditions that make blood vessels contract and tighten (spasm). Without treatment, PVD tends to get worse over time. PVD can also lead to acute ischemic limb. This is when an arm or limb suddenly has trouble getting enough blood. This is a medical emergency. CAUSES Each type of PVD has many different causes. The most common cause of PVD is buildup of a fatty material (plaque) inside of your arteries (atherosclerosis). Small amounts of plaque can break off from the walls of the blood vessels and become lodged in a smaller artery. This blocks blood flow and can cause acute ischemic limb. Other common causes of PVD include:  Blood clots that form inside of blood vessels.  Injuries to blood vessels.  Diseases that cause inflammation of blood vessels or cause blood vessel spasms.  Health behaviors and health history that increase your risk of developing PVD. RISK FACTORS  You may have a greater risk of PVD if you:  Have a family history of PVD.  Have certain medical conditions, including:  High cholesterol.  Diabetes.  High blood pressure (hypertension).  Coronary heart disease.  Past problems with blood clots.  Past injury, such as burns or a broken bone. These may have damaged blood vessels in your limbs.  Buerger disease. This is caused by inflamed blood  vessels in your hands and feet.  Some forms of arthritis.  Rare birth defects that affect the arteries in your legs.  Use tobacco.  Do not get enough exercise.  Are obese.  Are age 50 or older. SIGNS AND SYMPTOMS  PVD may cause many different symptoms. Your symptoms depend on what part of your body is not getting enough blood. Some common signs and symptoms include:  Cramps in your lower legs. This may be a symptom of poor leg circulation (claudication).  Pain and weakness in your legs while you are physically active that goes away when you rest (intermittent claudication).  Leg pain when at rest.  Leg numbness, tingling, or weakness.  Coldness in a leg or foot, especially when compared with the other leg.  Skin or hair changes. These can include:  Hair loss.  Shiny skin.  Pale or bluish skin.  Thick toenails.  Inability to get or maintain an erection (erectile dysfunction). People with PVD are more prone to developing ulcers and sores on their toes, feet, or legs. These may take longer than normal to heal. DIAGNOSIS Your health care provider may diagnose PVD from your signs and symptoms. The health care provider will also do a physical exam. You may have tests to find out what is causing your PVD and determine its severity. Tests may include:  Blood pressure recordings from your arms and legs and measurements of the strength of your pulses (pulse volume recordings).  Imaging studies using sound waves to take pictures of   the blood flow through your blood vessels (Doppler ultrasound).  Injecting a dye into your blood vessels before having imaging studies using:  X-rays (angiogram or arteriogram).  Computer-generated X-rays (CT angiogram).  A powerful electromagnetic field and a computer (magnetic resonance angiogram or MRA). TREATMENT Treatment for PVD depends on the cause of your condition and the severity of your symptoms. It also depends on your age. Underlying  causes need to be treated and controlled. These include long-lasting (chronic) conditions, such as diabetes, high cholesterol, and high blood pressure. You may need to first try making lifestyle changes and taking medicines. Surgery may be needed if these do not work. Lifestyle changes may include:  Quitting smoking.  Exercising regularly.  Following a low-fat, low-cholesterol diet. Medicines may include:  Blood thinners to prevent blood clots.  Medicines to improve blood flow.  Medicines to improve your blood cholesterol levels. Surgical procedures may include:  A procedure that uses an inflated balloon to open a blocked artery and improve blood flow (angioplasty).  A procedure to put in a tube (stent) to keep a blocked artery open (stent implant).  Surgery to reroute blood flow around a blocked artery (peripheral bypass surgery).  Surgery to remove dead tissue from an infected wound on the affected limb.  Amputation. This is surgical removal of the affected limb. This may be necessary in cases of acute ischemic limb that are not improved through medical or surgical treatments. HOME CARE INSTRUCTIONS  Take medicines only as directed by your health care provider.  Do not use any tobacco products, including cigarettes, chewing tobacco, or electronic cigarettes. If you need help quitting, ask your health care provider.  Lose weight if you are overweight, and maintain a healthy weight as directed by your health care provider.  Eat a diet that is low in fat and cholesterol. If you need help, ask your health care provider.  Exercise regularly. Ask your health care provider to suggest some good activities for you.  Use compression stockings or other mechanical devices as directed by your health care provider.  Take good care of your feet.  Wear comfortable shoes that fit well.  Check your feet often for any cuts or sores. SEEK MEDICAL CARE IF:  You have cramps in your legs  while walking.  You have leg pain when you are at rest.  You have coldness in a leg or foot.  Your skin changes.  You have erectile dysfunction.  You have cuts or sores on your feet that are not healing. SEEK IMMEDIATE MEDICAL CARE IF:  Your arm or leg turns cold and blue.  Your arms or legs become red, warm, swollen, painful, or numb.  You have chest pain or trouble breathing.  You suddenly have weakness in your face, arm, or leg.  You become very confused or lose the ability to speak.  You suddenly have a very bad headache or lose your vision.   This information is not intended to replace advice given to you by your health care provider. Make sure you discuss any questions you have with your health care provider.   Document Released: 09/18/2004 Document Revised: 09/01/2014 Document Reviewed: 01/19/2014 Elsevier Interactive Patient Education 2016 [employer] Inc[employer]     Steps to Quit Smoking  Smoking tobacco can be harmful to your health and can affect almost every organ in your body. Smoking puts you, and those around you, at risk for developing many serious chronic diseases. Quitting smoking is difficult, but it is one of   the best things that you can do for your health. It is never too late to quit. WHAT ARE THE BENEFITS OF QUITTING SMOKING? When you quit smoking, you lower your risk of developing serious diseases and conditions, such as:  Lung cancer or lung disease, such as COPD.  Heart disease.  Stroke.  Heart attack.  Infertility.  Osteoporosis and bone fractures. Additionally, symptoms such as coughing, wheezing, and shortness of breath may get better when you quit. You may also find that you get sick less often because your body is stronger at fighting off colds and infections. If you are pregnant, quitting smoking can help to reduce your chances of having a baby of low birth weight. HOW DO I GET READY TO QUIT? When you decide to quit smoking, create a plan to  make sure that you are successful. Before you quit:  Pick a date to quit. Set a date within the next two weeks to give you time to prepare.  Write down the reasons why you are quitting. Keep this list in places where you will see it often, such as on your bathroom mirror or in your car or wallet.  Identify the people, places, things, and activities that make you want to smoke (triggers) and avoid them. Make sure to take these actions:  Throw away all cigarettes at home, at work, and in your car.  Throw away smoking accessories, such as ashtrays and lighters.  Clean your car and make sure to empty the ashtray.  Clean your home, including curtains and carpets.  Tell your family, friends, and coworkers that you are quitting. Support from your loved ones can make quitting easier.  Talk with your health care provider about your options for quitting smoking.  Find out what treatment options are covered by your health insurance. WHAT STRATEGIES CAN I USE TO QUIT SMOKING?  Talk with your healthcare provider about different strategies to quit smoking. Some strategies include:  Quitting smoking altogether instead of gradually lessening how much you smoke over a period of time. Research shows that quitting "cold turkey" is more successful than gradually quitting.  Attending in-person counseling to help you build problem-solving skills. You are more likely to have success in quitting if you attend several counseling sessions. Even short sessions of 10 minutes can be effective.  Finding resources and support systems that can help you to quit smoking and remain smoke-free after you quit. These resources are most helpful when you use them often. They can include:  Online chats with a counselor.  Telephone quitlines.  Printed self-help materials.  Support groups or group counseling.  Text messaging programs.  Mobile phone applications.  Taking medicines to help you quit smoking. (If you are  pregnant or breastfeeding, talk with your health care provider first.) Some medicines contain nicotine and some do not. Both types of medicines help with cravings, but the medicines that include nicotine help to relieve withdrawal symptoms. Your health care provider may recommend:  Nicotine patches, gum, or lozenges.  Nicotine inhalers or sprays.  Non-nicotine medicine that is taken by mouth. Talk with your health care provider about combining strategies, such as taking medicines while you are also receiving in-person counseling. Using these two strategies together makes you more likely to succeed in quitting than if you used either strategy on its own. If you are pregnant or breastfeeding, talk with your health care provider about finding counseling or other support strategies to quit smoking. Do not take medicine to help you   quit smoking unless told to do so by your health care provider. WHAT THINGS CAN I DO TO MAKE IT EASIER TO QUIT? Quitting smoking might feel overwhelming at first, but there is a lot that you can do to make it easier. Take these important actions:  Reach out to your family and friends and ask that they support and encourage you during this time. Call telephone quitlines, reach out to support groups, or work with a counselor for support.  Ask people who smoke to avoid smoking around you.  Avoid places that trigger you to smoke, such as bars, parties, or smoke-break areas at work.  Spend time around people who do not smoke.  Lessen stress in your life, because stress can be a smoking trigger for some people. To lessen stress, try:  Exercising regularly.  Deep-breathing exercises.  Yoga.  Meditating.  Performing a body scan. This involves closing your eyes, scanning your body from head to toe, and noticing which parts of your body are particularly tense. Purposefully relax the muscles in those areas.  Download or purchase mobile phone or tablet apps (applications)  that can help you stick to your quit plan by providing reminders, tips, and encouragement. There are many free apps, such as QuitGuide from the CDC (Centers for Disease Control and Prevention). You can find other support for quitting smoking (smoking cessation) through smokefree.gov and other websites. HOW WILL I FEEL WHEN I QUIT SMOKING? Within the first 24 hours of quitting smoking, you may start to feel some withdrawal symptoms. These symptoms are usually most noticeable 2-3 days after quitting, but they usually do not last beyond 2-3 weeks. Changes or symptoms that you might experience include:  Mood swings.  Restlessness, anxiety, or irritation.  Difficulty concentrating.  Dizziness.  Strong cravings for sugary foods in addition to nicotine.  Mild weight gain.  Constipation.  Nausea.  Coughing or a sore throat.  Changes in how your medicines work in your body.  A depressed mood.  Difficulty sleeping (insomnia). After the first 2-3 weeks of quitting, you may start to notice more positive results, such as:  Improved sense of smell and taste.  Decreased coughing and sore throat.  Slower heart rate.  Lower blood pressure.  Clearer skin.  The ability to breathe more easily.  Fewer sick days. Quitting smoking is very challenging for most people. Do not get discouraged if you are not successful the first time. Some people need to make many attempts to quit before they achieve long-term success. Do your best to stick to your quit plan, and talk with your health care provider if you have any questions or concerns.   This information is not intended to replace advice given to you by your health care provider. Make sure you discuss any questions you have with your health care provider.   Document Released: 08/05/2001 Document Revised: 12/26/2014 Document Reviewed: 12/26/2014 Elsevier Interactive Patient Education 2016 [employer]  

## 2015-10-18 DIAGNOSIS — Z1211 Encounter for screening for malignant neoplasm of colon: Secondary | ICD-10-CM | POA: Diagnosis not present

## 2015-11-20 DIAGNOSIS — J449 Chronic obstructive pulmonary disease, unspecified: Secondary | ICD-10-CM | POA: Diagnosis not present

## 2015-11-20 DIAGNOSIS — F17210 Nicotine dependence, cigarettes, uncomplicated: Secondary | ICD-10-CM | POA: Diagnosis not present

## 2015-11-20 DIAGNOSIS — E1151 Type 2 diabetes mellitus with diabetic peripheral angiopathy without gangrene: Secondary | ICD-10-CM | POA: Diagnosis not present

## 2015-11-20 DIAGNOSIS — Z7984 Long term (current) use of oral hypoglycemic drugs: Secondary | ICD-10-CM | POA: Diagnosis not present

## 2015-11-20 DIAGNOSIS — I10 Essential (primary) hypertension: Secondary | ICD-10-CM | POA: Diagnosis not present

## 2015-11-20 DIAGNOSIS — E7800 Pure hypercholesterolemia, unspecified: Secondary | ICD-10-CM | POA: Diagnosis not present

## 2015-11-20 DIAGNOSIS — I739 Peripheral vascular disease, unspecified: Secondary | ICD-10-CM | POA: Diagnosis not present

## 2016-03-12 DIAGNOSIS — E1151 Type 2 diabetes mellitus with diabetic peripheral angiopathy without gangrene: Secondary | ICD-10-CM | POA: Diagnosis not present

## 2016-03-12 DIAGNOSIS — F17210 Nicotine dependence, cigarettes, uncomplicated: Secondary | ICD-10-CM | POA: Diagnosis not present

## 2016-03-12 DIAGNOSIS — I10 Essential (primary) hypertension: Secondary | ICD-10-CM | POA: Diagnosis not present

## 2016-03-12 DIAGNOSIS — E7800 Pure hypercholesterolemia, unspecified: Secondary | ICD-10-CM | POA: Diagnosis not present

## 2016-03-12 DIAGNOSIS — J449 Chronic obstructive pulmonary disease, unspecified: Secondary | ICD-10-CM | POA: Diagnosis not present

## 2016-03-12 DIAGNOSIS — Z7984 Long term (current) use of oral hypoglycemic drugs: Secondary | ICD-10-CM | POA: Diagnosis not present

## 2016-03-12 DIAGNOSIS — I739 Peripheral vascular disease, unspecified: Secondary | ICD-10-CM | POA: Diagnosis not present

## 2016-06-04 DIAGNOSIS — Z23 Encounter for immunization: Secondary | ICD-10-CM | POA: Diagnosis not present

## 2016-07-23 DIAGNOSIS — Z7984 Long term (current) use of oral hypoglycemic drugs: Secondary | ICD-10-CM | POA: Diagnosis not present

## 2016-07-23 DIAGNOSIS — E1151 Type 2 diabetes mellitus with diabetic peripheral angiopathy without gangrene: Secondary | ICD-10-CM | POA: Diagnosis not present

## 2016-07-23 DIAGNOSIS — F17210 Nicotine dependence, cigarettes, uncomplicated: Secondary | ICD-10-CM | POA: Diagnosis not present

## 2016-07-23 DIAGNOSIS — I10 Essential (primary) hypertension: Secondary | ICD-10-CM | POA: Diagnosis not present

## 2016-07-23 DIAGNOSIS — J449 Chronic obstructive pulmonary disease, unspecified: Secondary | ICD-10-CM | POA: Diagnosis not present

## 2016-07-23 DIAGNOSIS — I739 Peripheral vascular disease, unspecified: Secondary | ICD-10-CM | POA: Diagnosis not present

## 2016-07-23 DIAGNOSIS — E7800 Pure hypercholesterolemia, unspecified: Secondary | ICD-10-CM | POA: Diagnosis not present

## 2016-07-23 DIAGNOSIS — E2839 Other primary ovarian failure: Secondary | ICD-10-CM | POA: Diagnosis not present

## 2016-07-30 ENCOUNTER — Encounter: Payer: Self-pay | Admitting: Vascular Surgery

## 2016-08-06 ENCOUNTER — Encounter (HOSPITAL_COMMUNITY): Payer: Commercial Managed Care - HMO

## 2016-08-06 ENCOUNTER — Ambulatory Visit: Payer: Commercial Managed Care - HMO | Admitting: Family

## 2016-08-06 ENCOUNTER — Other Ambulatory Visit (HOSPITAL_COMMUNITY): Payer: Commercial Managed Care - HMO

## 2016-08-12 DIAGNOSIS — E2839 Other primary ovarian failure: Secondary | ICD-10-CM | POA: Diagnosis not present

## 2016-08-12 DIAGNOSIS — E7800 Pure hypercholesterolemia, unspecified: Secondary | ICD-10-CM | POA: Diagnosis not present

## 2016-08-12 DIAGNOSIS — F17210 Nicotine dependence, cigarettes, uncomplicated: Secondary | ICD-10-CM | POA: Diagnosis not present

## 2016-08-12 DIAGNOSIS — I739 Peripheral vascular disease, unspecified: Secondary | ICD-10-CM | POA: Diagnosis not present

## 2016-08-12 DIAGNOSIS — I10 Essential (primary) hypertension: Secondary | ICD-10-CM | POA: Diagnosis not present

## 2016-08-12 DIAGNOSIS — E1151 Type 2 diabetes mellitus with diabetic peripheral angiopathy without gangrene: Secondary | ICD-10-CM | POA: Diagnosis not present

## 2016-08-12 DIAGNOSIS — Z1389 Encounter for screening for other disorder: Secondary | ICD-10-CM | POA: Diagnosis not present

## 2016-08-12 DIAGNOSIS — J449 Chronic obstructive pulmonary disease, unspecified: Secondary | ICD-10-CM | POA: Diagnosis not present

## 2016-08-12 DIAGNOSIS — Z0000 Encounter for general adult medical examination without abnormal findings: Secondary | ICD-10-CM | POA: Diagnosis not present

## 2016-10-07 DIAGNOSIS — R51 Headache: Secondary | ICD-10-CM | POA: Diagnosis not present

## 2016-10-09 ENCOUNTER — Other Ambulatory Visit: Payer: Self-pay | Admitting: Internal Medicine

## 2016-10-09 DIAGNOSIS — R519 Headache, unspecified: Secondary | ICD-10-CM

## 2016-10-09 DIAGNOSIS — R51 Headache: Principal | ICD-10-CM

## 2016-10-17 ENCOUNTER — Other Ambulatory Visit: Payer: Commercial Managed Care - HMO

## 2016-10-17 ENCOUNTER — Ambulatory Visit
Admission: RE | Admit: 2016-10-17 | Discharge: 2016-10-17 | Disposition: A | Discharge status: Home / Self Care | Payer: Commercial Managed Care - HMO | Source: Ambulatory Visit | Attending: Internal Medicine | Admitting: Internal Medicine

## 2016-10-17 DIAGNOSIS — R519 Headache, unspecified: Secondary | ICD-10-CM

## 2016-10-17 DIAGNOSIS — R51 Headache: Secondary | ICD-10-CM | POA: Diagnosis not present

## 2016-11-06 DIAGNOSIS — H524 Presbyopia: Secondary | ICD-10-CM | POA: Diagnosis not present

## 2016-11-06 DIAGNOSIS — Z961 Presence of intraocular lens: Secondary | ICD-10-CM | POA: Diagnosis not present

## 2016-11-06 DIAGNOSIS — E119 Type 2 diabetes mellitus without complications: Secondary | ICD-10-CM | POA: Diagnosis not present

## 2016-11-19 DIAGNOSIS — I739 Peripheral vascular disease, unspecified: Secondary | ICD-10-CM | POA: Diagnosis not present

## 2016-11-19 DIAGNOSIS — J449 Chronic obstructive pulmonary disease, unspecified: Secondary | ICD-10-CM | POA: Diagnosis not present

## 2016-11-19 DIAGNOSIS — Z7984 Long term (current) use of oral hypoglycemic drugs: Secondary | ICD-10-CM | POA: Diagnosis not present

## 2016-11-19 DIAGNOSIS — I10 Essential (primary) hypertension: Secondary | ICD-10-CM | POA: Diagnosis not present

## 2016-11-19 DIAGNOSIS — E7800 Pure hypercholesterolemia, unspecified: Secondary | ICD-10-CM | POA: Diagnosis not present

## 2016-11-19 DIAGNOSIS — E1151 Type 2 diabetes mellitus with diabetic peripheral angiopathy without gangrene: Secondary | ICD-10-CM | POA: Diagnosis not present

## 2016-11-25 ENCOUNTER — Encounter: Payer: Self-pay | Admitting: Family

## 2016-12-03 ENCOUNTER — Encounter (INDEPENDENT_AMBULATORY_CARE_PROVIDER_SITE_OTHER): Payer: Medicare HMO | Admitting: Family

## 2016-12-03 ENCOUNTER — Ambulatory Visit (HOSPITAL_COMMUNITY)
Admission: RE | Admit: 2016-12-03 | Discharge: 2016-12-03 | Disposition: A | Discharge status: Home / Self Care | Payer: Medicare HMO | Source: Ambulatory Visit | Attending: Vascular Surgery | Admitting: Vascular Surgery

## 2016-12-03 ENCOUNTER — Encounter: Payer: Self-pay | Admitting: Family

## 2016-12-03 ENCOUNTER — Telehealth: Payer: Self-pay | Admitting: Family

## 2016-12-03 ENCOUNTER — Ambulatory Visit (INDEPENDENT_AMBULATORY_CARE_PROVIDER_SITE_OTHER)
Admission: RE | Admit: 2016-12-03 | Discharge: 2016-12-03 | Disposition: A | Discharge status: Home / Self Care | Payer: Medicare HMO | Source: Ambulatory Visit | Attending: Vascular Surgery | Admitting: Vascular Surgery

## 2016-12-03 VITALS — BP 123/70 | HR 72 | Temp 97.7°F | Resp 16 | Ht 63.5 in | Wt 141.0 lb

## 2016-12-03 DIAGNOSIS — I739 Peripheral vascular disease, unspecified: Secondary | ICD-10-CM

## 2016-12-03 DIAGNOSIS — Z95828 Presence of other vascular implants and grafts: Secondary | ICD-10-CM

## 2016-12-03 DIAGNOSIS — Z48812 Encounter for surgical aftercare following surgery on the circulatory system: Secondary | ICD-10-CM | POA: Insufficient documentation

## 2016-12-03 DIAGNOSIS — R0989 Other specified symptoms and signs involving the circulatory and respiratory systems: Secondary | ICD-10-CM | POA: Diagnosis present

## 2016-12-03 NOTE — Patient Instructions (Addendum)
Dear Allison [name] , your recent Vascular Lab testing on 12-03-16 indicates: No significant change in either lower extremity. Please return in 1 year for repeat testing. Please contact our office if you have any questions. [phone] Thank You,     Peripheral Vascular Disease Peripheral vascular disease (PVD) is a disease of the blood vessels that are not part of your heart and brain. A simple term for PVD is poor circulation. In most cases, PVD narrows the blood vessels that carry blood from your heart to the rest of your body. This can result in a decreased supply of blood to your arms, legs, and internal organs, like your stomach or kidneys. However, it most often affects a person's lower legs and feet. There are two types of PVD.  Organic PVD. This is the more common type. It is caused by damage to the structure of blood vessels.  Functional PVD. This is caused by conditions that make blood vessels contract and tighten (spasm). Without treatment, PVD tends to get worse over time. PVD can also lead to acute ischemic limb. This is when an arm or limb suddenly has trouble getting enough blood. This is a medical emergency. Follow these instructions at home:  Take medicines only as told by your doctor.  Do not use any tobacco products, including cigarettes, chewing tobacco, or electronic cigarettes. If you need help quitting, ask your doctor.  Lose weight if you are overweight, and maintain a healthy weight as told by your doctor.  Eat a diet that is low in fat and cholesterol. If you need help, ask your doctor.  Exercise regularly. Ask your doctor for some good activities for you.  Take good care of your feet.  Wear comfortable shoes that fit well.  Check your feet often for any cuts or sores. Contact a doctor if:  You have cramps in your legs while walking.  You have leg pain when you are at rest.  You have coldness in a leg or foot.  Your skin changes.  You are unable to  get or have an erection (erectile dysfunction).  You have cuts or sores on your feet that are not healing. Get help right away if:  Your arm or leg turns cold and blue.  Your arms or legs become red, warm, swollen, painful, or numb.  You have chest pain or trouble breathing.  You suddenly have weakness in your face, arm, or leg.  You become very confused or you cannot speak.  You suddenly have a very bad headache.  You suddenly cannot see. This information is not intended to replace advice given to you by your health care provider. Make sure you discuss any questions you have with your health care provider. Document Released: 11/05/2009 Document Revised: 01/17/2016 Document Reviewed: 01/19/2014 Elsevier Interactive Patient Education  2017 [employer].      Steps to Quit Smoking Smoking tobacco can be bad for your health. It can also affect almost every organ in your body. Smoking puts you and people around you at risk for many serious long-lasting (chronic) diseases. Quitting smoking is hard, but it is one of the best things that you can do for your health. It is never too late to quit. What are the benefits of quitting smoking? When you quit smoking, you lower your risk for getting serious diseases and conditions. They can include:  Lung cancer or lung disease.  Heart disease.  Stroke.  Heart attack.  Not being able to have children (infertility).  Weak bones (osteoporosis) and broken bones (fractures). If you have coughing, wheezing, and shortness of breath, those symptoms may get better when you quit. You may also get sick less often. If you are pregnant, quitting smoking can help to lower your chances of having a baby of low birth weight. What can I do to help me quit smoking? Talk with your doctor about what can help you quit smoking. Some things you can do (strategies) include:  Quitting smoking totally, instead of slowly cutting back how much you smoke over a  period of time.  Going to in-person counseling. You are more likely to quit if you go to many counseling sessions.  Using resources and support systems, such as:  Online chats with a Social worker.  Phone quitlines.  Printed Furniture conservator/restorer.  Support groups or group counseling.  Text messaging programs.  Mobile phone apps or applications.  Taking medicines. Some of these medicines may have nicotine in them. If you are pregnant or breastfeeding, do not take any medicines to quit smoking unless your doctor says it is okay. Talk with your doctor about counseling or other things that can help you. Talk with your doctor about using more than one strategy at the same time, such as taking medicines while you are also going to in-person counseling. This can help make quitting easier. What things can I do to make it easier to quit? Quitting smoking might feel very hard at first, but there is a lot that you can do to make it easier. Take these steps:  Talk to your family and friends. Ask them to support and encourage you.  Call phone quitlines, reach out to support groups, or work with a Social worker.  Ask people who smoke to not smoke around you.  Avoid places that make you want (trigger) to smoke, such as:  Bars.  Parties.  Smoke-break areas at work.  Spend time with people who do not smoke.  Lower the stress in your life. Stress can make you want to smoke. Try these things to help your stress:  Getting regular exercise.  Deep-breathing exercises.  Yoga.  Meditating.  Doing a body scan. To do this, close your eyes, focus on one area of your body at a time from head to toe, and notice which parts of your body are tense. Try to relax the muscles in those areas.  Download or buy apps on your mobile phone or tablet that can help you stick to your quit plan. There are many free apps, such as QuitGuide from the [employer] Office manager for Disease Control and Prevention). You can find more  support from smokefree.gov and other websites. This information is not intended to replace advice given to you by your health care provider. Make sure you discuss any questions you have with your health care provider. Document Released: 06/07/2009 Document Revised: 04/08/2016 Document Reviewed: 12/26/2014 Elsevier Interactive Patient Education  2017 [employer].

## 2016-12-03 NOTE — Progress Notes (Signed)
Patient left after non invasive vascular labs performed. Her appointment with me was at 11:15; she was not in the room at 10:52; I was informed that patient had left.

## 2016-12-03 NOTE — Telephone Encounter (Signed)
Patient arrived for appointment today and had ultrasounds-her appointment to see the NP wasn't until 11:15. At 10:50, the patient told Katy Apo that she was tired of waiting to see the NP and decided to leave.

## 2016-12-11 NOTE — Addendum Note (Signed)
Addended by: Mena Goes on: 12/11/2016 08:20 AM   Modules accepted: Orders

## 2016-12-15 DIAGNOSIS — M8588 Other specified disorders of bone density and structure, other site: Secondary | ICD-10-CM | POA: Diagnosis not present

## 2016-12-15 DIAGNOSIS — E2839 Other primary ovarian failure: Secondary | ICD-10-CM | POA: Diagnosis not present

## 2017-03-18 DIAGNOSIS — J449 Chronic obstructive pulmonary disease, unspecified: Secondary | ICD-10-CM | POA: Diagnosis not present

## 2017-03-18 DIAGNOSIS — E1151 Type 2 diabetes mellitus with diabetic peripheral angiopathy without gangrene: Secondary | ICD-10-CM | POA: Diagnosis not present

## 2017-03-18 DIAGNOSIS — E1129 Type 2 diabetes mellitus with other diabetic kidney complication: Secondary | ICD-10-CM | POA: Diagnosis not present

## 2017-03-18 DIAGNOSIS — I10 Essential (primary) hypertension: Secondary | ICD-10-CM | POA: Diagnosis not present

## 2017-03-18 DIAGNOSIS — I739 Peripheral vascular disease, unspecified: Secondary | ICD-10-CM | POA: Diagnosis not present

## 2017-03-18 DIAGNOSIS — F17210 Nicotine dependence, cigarettes, uncomplicated: Secondary | ICD-10-CM | POA: Diagnosis not present

## 2017-03-18 DIAGNOSIS — E7800 Pure hypercholesterolemia, unspecified: Secondary | ICD-10-CM | POA: Diagnosis not present

## 2017-06-03 DIAGNOSIS — Z23 Encounter for immunization: Secondary | ICD-10-CM | POA: Diagnosis not present

## 2017-09-03 DIAGNOSIS — E7800 Pure hypercholesterolemia, unspecified: Secondary | ICD-10-CM | POA: Diagnosis not present

## 2017-09-03 DIAGNOSIS — I10 Essential (primary) hypertension: Secondary | ICD-10-CM | POA: Diagnosis not present

## 2017-09-03 DIAGNOSIS — F17210 Nicotine dependence, cigarettes, uncomplicated: Secondary | ICD-10-CM | POA: Diagnosis not present

## 2017-09-03 DIAGNOSIS — J449 Chronic obstructive pulmonary disease, unspecified: Secondary | ICD-10-CM | POA: Diagnosis not present

## 2017-09-03 DIAGNOSIS — Z1389 Encounter for screening for other disorder: Secondary | ICD-10-CM | POA: Diagnosis not present

## 2017-09-03 DIAGNOSIS — Z0000 Encounter for general adult medical examination without abnormal findings: Secondary | ICD-10-CM | POA: Diagnosis not present

## 2017-09-03 DIAGNOSIS — E1151 Type 2 diabetes mellitus with diabetic peripheral angiopathy without gangrene: Secondary | ICD-10-CM | POA: Diagnosis not present

## 2017-09-03 DIAGNOSIS — I739 Peripheral vascular disease, unspecified: Secondary | ICD-10-CM | POA: Diagnosis not present

## 2017-10-20 DIAGNOSIS — R0602 Shortness of breath: Secondary | ICD-10-CM | POA: Diagnosis not present

## 2017-10-20 DIAGNOSIS — R05 Cough: Secondary | ICD-10-CM | POA: Diagnosis not present

## 2017-10-20 DIAGNOSIS — J159 Unspecified bacterial pneumonia: Secondary | ICD-10-CM | POA: Diagnosis not present

## 2017-10-20 DIAGNOSIS — I10 Essential (primary) hypertension: Secondary | ICD-10-CM | POA: Diagnosis not present

## 2017-10-23 DIAGNOSIS — J159 Unspecified bacterial pneumonia: Secondary | ICD-10-CM | POA: Diagnosis not present

## 2017-10-23 DIAGNOSIS — J441 Chronic obstructive pulmonary disease with (acute) exacerbation: Secondary | ICD-10-CM | POA: Diagnosis not present

## 2017-10-23 DIAGNOSIS — I10 Essential (primary) hypertension: Secondary | ICD-10-CM | POA: Diagnosis not present

## 2017-11-06 DIAGNOSIS — J159 Unspecified bacterial pneumonia: Secondary | ICD-10-CM | POA: Diagnosis not present

## 2017-11-06 DIAGNOSIS — I10 Essential (primary) hypertension: Secondary | ICD-10-CM | POA: Diagnosis not present

## 2017-12-07 ENCOUNTER — Other Ambulatory Visit (HOSPITAL_COMMUNITY): Payer: Commercial Managed Care - HMO

## 2017-12-07 ENCOUNTER — Ambulatory Visit: Payer: Commercial Managed Care - HMO | Admitting: Family

## 2017-12-07 ENCOUNTER — Encounter (HOSPITAL_COMMUNITY): Payer: Commercial Managed Care - HMO

## 2017-12-23 DIAGNOSIS — E7800 Pure hypercholesterolemia, unspecified: Secondary | ICD-10-CM | POA: Diagnosis not present

## 2017-12-23 DIAGNOSIS — F17200 Nicotine dependence, unspecified, uncomplicated: Secondary | ICD-10-CM | POA: Diagnosis not present

## 2017-12-23 DIAGNOSIS — E119 Type 2 diabetes mellitus without complications: Secondary | ICD-10-CM | POA: Diagnosis not present

## 2017-12-23 DIAGNOSIS — Z87891 Personal history of nicotine dependence: Secondary | ICD-10-CM | POA: Diagnosis not present

## 2017-12-23 DIAGNOSIS — I10 Essential (primary) hypertension: Secondary | ICD-10-CM | POA: Diagnosis not present

## 2017-12-23 DIAGNOSIS — J159 Unspecified bacterial pneumonia: Secondary | ICD-10-CM | POA: Diagnosis not present

## 2017-12-23 DIAGNOSIS — Z79899 Other long term (current) drug therapy: Secondary | ICD-10-CM | POA: Diagnosis not present

## 2018-01-07 DIAGNOSIS — M79605 Pain in left leg: Secondary | ICD-10-CM | POA: Diagnosis not present

## 2018-01-07 DIAGNOSIS — M79604 Pain in right leg: Secondary | ICD-10-CM | POA: Diagnosis not present

## 2018-01-07 DIAGNOSIS — R0989 Other specified symptoms and signs involving the circulatory and respiratory systems: Secondary | ICD-10-CM | POA: Diagnosis not present

## 2018-01-13 IMAGING — MR MR HEAD W/O CM
9 series · 48 of 48 positions shown · non-contrast
Comparison: None.

CLINICAL DATA: Headache, left-sided predominant. Intermittent but
progressively worsening over the last 3-4 weeks.

EXAM:
MRI HEAD WITHOUT CONTRAST
TECHNIQUE: Multiplanar, multiecho pulse sequences of the brain and surrounding
structures were obtained without intravenous contrast.

[Series 2: T1 · sagittal · 5.0mm · 0.45mm/px · 3 of 21 slices shown]
[im 1/21]
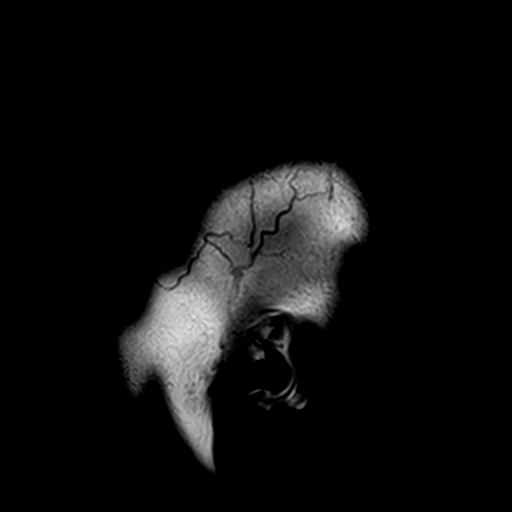
[im 11/21]
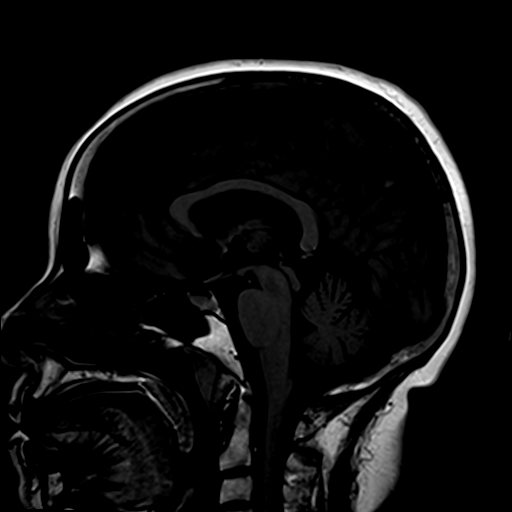
[im 21/21]
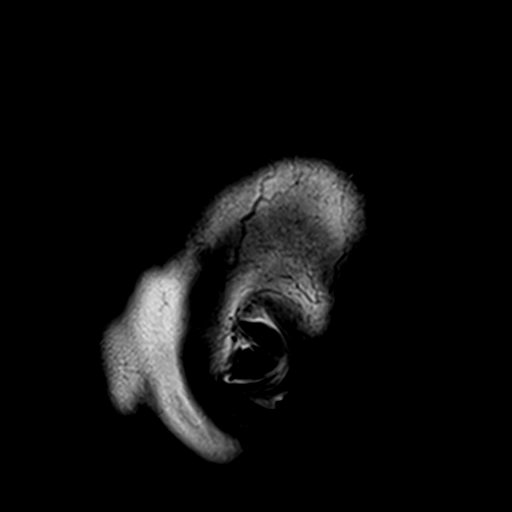

[Series 3: DWI · axial · 3.0mm · 1.80mm/px · z∈[-50,+94]mm · 8 of 99 slices shown (1 of 2)]
[im 1/99]
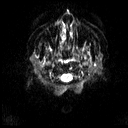
[im 15/99]
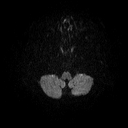
[im 29/99]
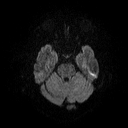
[im 43/99]
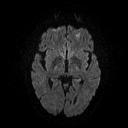
[im 57/99]
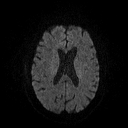
[im 71/99]
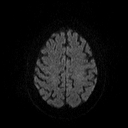
[im 85/99]
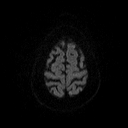
[im 99/99]
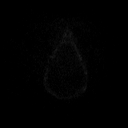

[Series 4: DWI · axial · 3.0mm · 1.80mm/px · z∈[-50,+94]mm · 4 of 50 slices shown (2 of 2)]
[im 1/50]
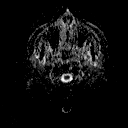
[im 17/50]
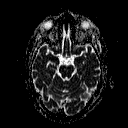
[im 33/50]
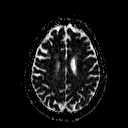
[im 50/50]
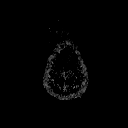

[Series 5: T2 · axial · 5.0mm · 0.51mm/px · z∈[-64,+94]mm · 2 of 24 slices shown (1 of 2)]
[im 1/24]
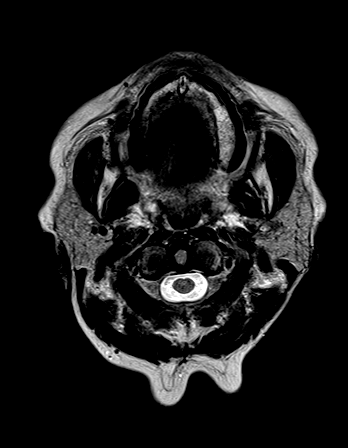
[im 24/24]
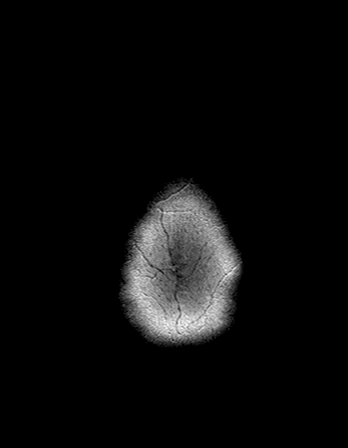

[Series 6: FLAIR · axial · 3.0mm · 0.45mm/px · z∈[-53,+92]mm · 4 of 50 slices shown]
[im 1/50]
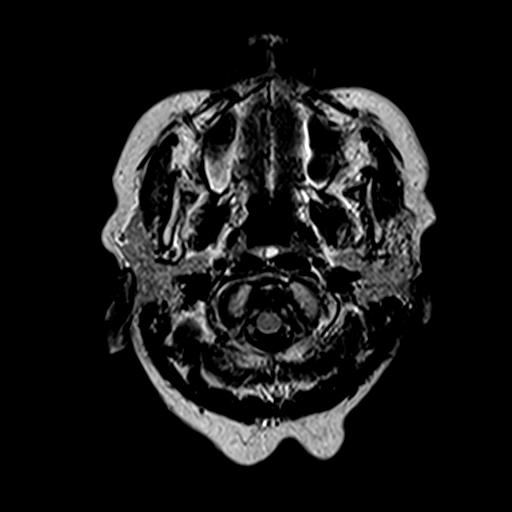
[im 17/50]
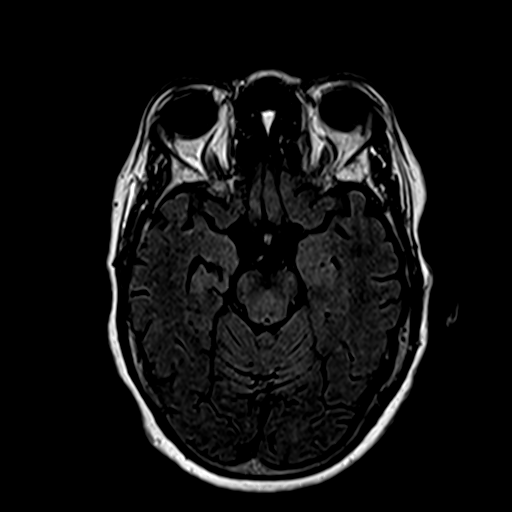
[im 33/50]
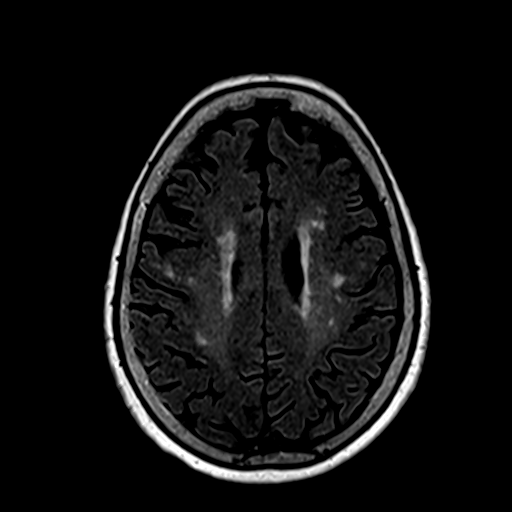
[im 50/50]
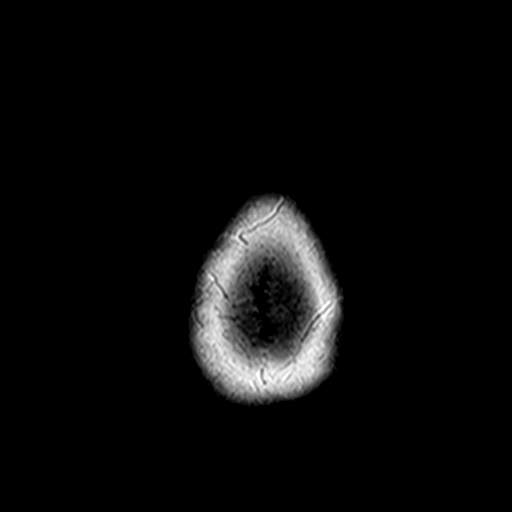

[Series 7: mip_images(sw) · axial · 16.0mm · 0.90mm/px · z∈[-52,+90]mm · 6 of 73 slices shown]
[im 1/73]
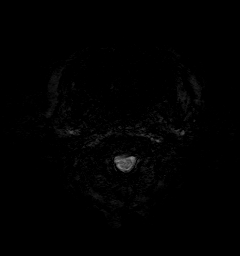
[im 15/73]
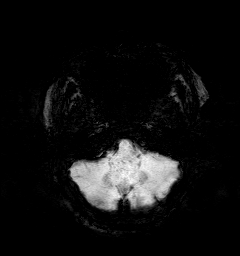
[im 29/73]
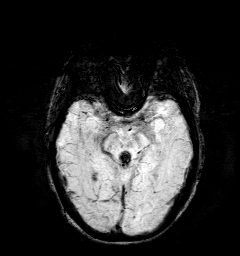
[im 44/73]
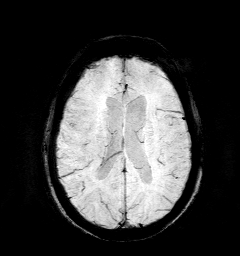
[im 58/73]
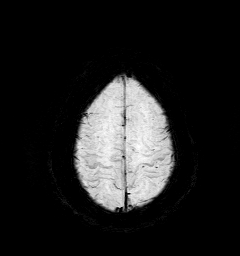
[im 73/73]
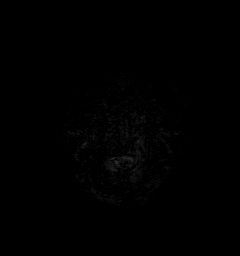

[Series 8: swi_images · axial · 2.0mm · 0.90mm/px · z∈[-59,+97]mm · 7 of 80 slices shown]
[im 1/80]
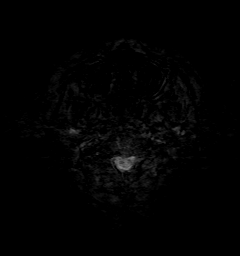
[im 14/80]
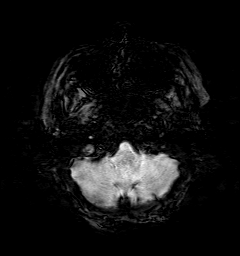
[im 27/80]
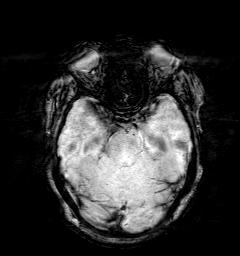
[im 40/80]
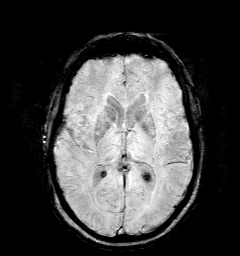
[im 53/80]
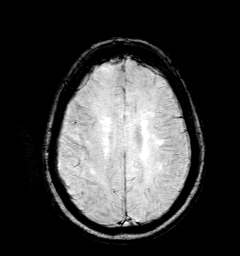
[im 66/80]
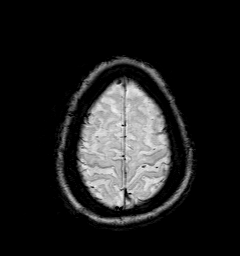
[im 80/80]
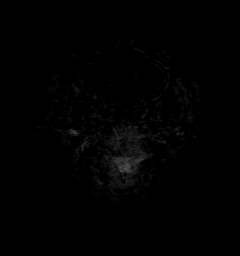

[Series 9: T2 · coronal · 5.0mm · 0.45mm/px · 2 of 26 slices shown (2 of 2)]
[im 1/26]
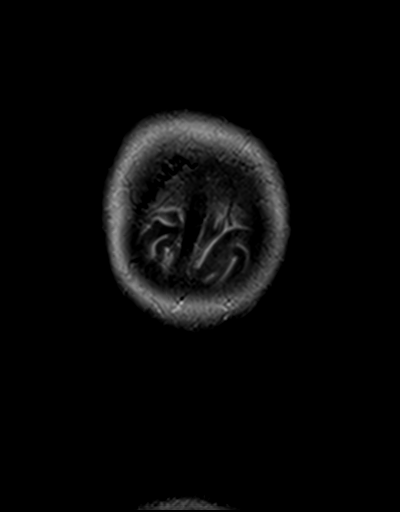
[im 26/26]
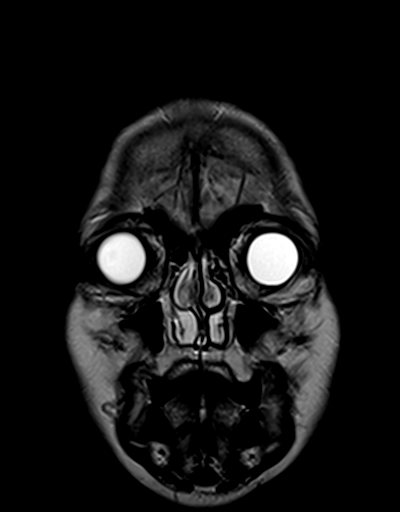

[Series 10: t1_mpr_tra · axial · 1.0mm · 0.72mm/px · z∈[-46,+94]mm · 12 of 144 slices shown]
[im 1/144]
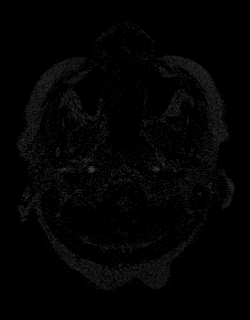
[im 14/144]
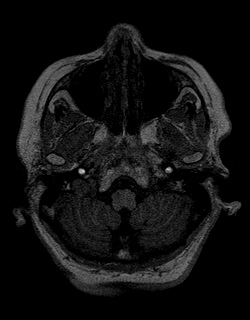
[im 27/144]
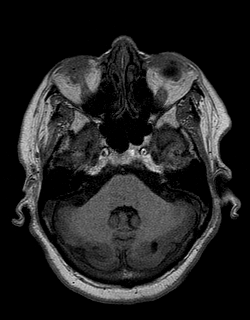
[im 40/144]
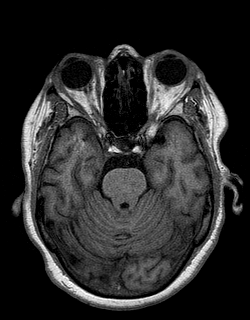
[im 53/144]
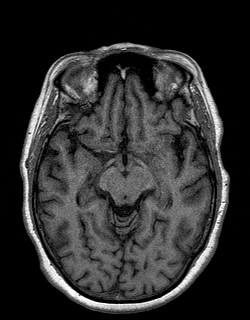
[im 66/144]
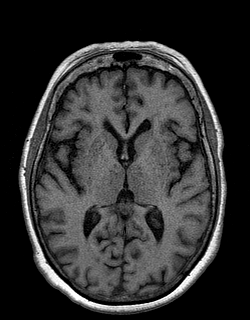
[im 79/144]
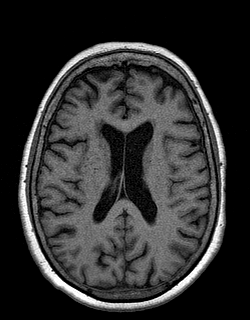
[im 92/144]
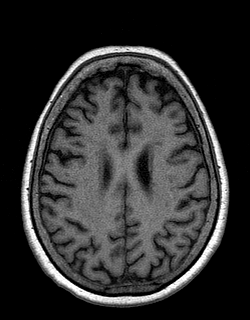
[im 105/144]
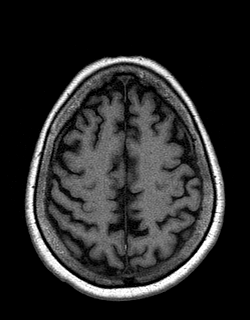
[im 118/144]
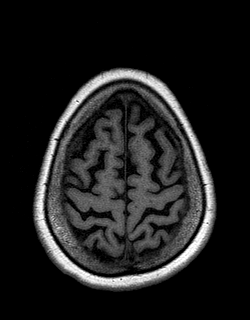
[im 131/144]
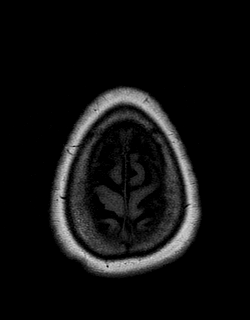
[im 144/144]
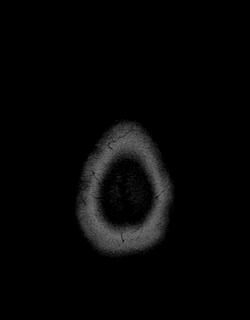

[48 of 48 positions shown; findings below may reference images not displayed]

FINDINGS: Brain: Diffusion imaging does not show any acute or subacute
infarction. There chronic small-vessel ischemic changes affecting
the pons. There are a few old small vessel cerebellar infarctions.
Cerebral hemispheres show chronic small-vessel ischemic changes
affecting the thalami, basal ganglia and hemispheric white matter,
mild to moderate in degree, fairly typically seen in persons of this
age. No cortical or large vessel territory infarction. No evidence
of mass lesion, hemorrhage, hydrocephalus or extra-axial collection.
No pituitary mass.

Vascular: Major vessels at the base of the brain show flow.

Skull and upper cervical spine: Negative

Sinuses/Orbits: Clear/normal.

Other: None significant
IMPRESSION: No cause of headaches identified. Old small vessel infarctions
affecting the brain as outlined above, fairly typical for age.

## 2018-01-26 DIAGNOSIS — M79605 Pain in left leg: Secondary | ICD-10-CM | POA: Diagnosis not present

## 2018-01-26 DIAGNOSIS — M79604 Pain in right leg: Secondary | ICD-10-CM | POA: Diagnosis not present

## 2018-01-26 DIAGNOSIS — E119 Type 2 diabetes mellitus without complications: Secondary | ICD-10-CM | POA: Diagnosis not present

## 2018-01-26 DIAGNOSIS — F17200 Nicotine dependence, unspecified, uncomplicated: Secondary | ICD-10-CM | POA: Diagnosis not present

## 2018-01-26 DIAGNOSIS — Z87891 Personal history of nicotine dependence: Secondary | ICD-10-CM | POA: Diagnosis not present

## 2018-01-26 DIAGNOSIS — I10 Essential (primary) hypertension: Secondary | ICD-10-CM | POA: Diagnosis not present

## 2018-02-12 DIAGNOSIS — M4316 Spondylolisthesis, lumbar region: Secondary | ICD-10-CM | POA: Diagnosis not present

## 2018-02-12 DIAGNOSIS — M545 Low back pain: Secondary | ICD-10-CM | POA: Diagnosis not present

## 2018-03-04 DIAGNOSIS — F17210 Nicotine dependence, cigarettes, uncomplicated: Secondary | ICD-10-CM | POA: Diagnosis not present

## 2018-03-04 DIAGNOSIS — I739 Peripheral vascular disease, unspecified: Secondary | ICD-10-CM | POA: Diagnosis not present

## 2018-03-04 DIAGNOSIS — E7800 Pure hypercholesterolemia, unspecified: Secondary | ICD-10-CM | POA: Diagnosis not present

## 2018-03-04 DIAGNOSIS — Z7984 Long term (current) use of oral hypoglycemic drugs: Secondary | ICD-10-CM | POA: Diagnosis not present

## 2018-03-04 DIAGNOSIS — J449 Chronic obstructive pulmonary disease, unspecified: Secondary | ICD-10-CM | POA: Diagnosis not present

## 2018-03-04 DIAGNOSIS — E1151 Type 2 diabetes mellitus with diabetic peripheral angiopathy without gangrene: Secondary | ICD-10-CM | POA: Diagnosis not present

## 2018-03-04 DIAGNOSIS — I10 Essential (primary) hypertension: Secondary | ICD-10-CM | POA: Diagnosis not present

## 2018-03-30 DIAGNOSIS — E119 Type 2 diabetes mellitus without complications: Secondary | ICD-10-CM | POA: Diagnosis not present

## 2018-03-30 DIAGNOSIS — M25552 Pain in left hip: Secondary | ICD-10-CM | POA: Diagnosis not present

## 2018-03-30 DIAGNOSIS — E7800 Pure hypercholesterolemia, unspecified: Secondary | ICD-10-CM | POA: Diagnosis not present

## 2018-03-30 DIAGNOSIS — G8929 Other chronic pain: Secondary | ICD-10-CM | POA: Diagnosis not present

## 2018-03-30 DIAGNOSIS — I10 Essential (primary) hypertension: Secondary | ICD-10-CM | POA: Diagnosis not present

## 2018-04-23 DIAGNOSIS — M545 Low back pain: Secondary | ICD-10-CM | POA: Diagnosis not present

## 2018-04-23 DIAGNOSIS — M4316 Spondylolisthesis, lumbar region: Secondary | ICD-10-CM | POA: Diagnosis not present

## 2018-05-07 DIAGNOSIS — Z961 Presence of intraocular lens: Secondary | ICD-10-CM | POA: Diagnosis not present

## 2018-05-07 DIAGNOSIS — H52203 Unspecified astigmatism, bilateral: Secondary | ICD-10-CM | POA: Diagnosis not present

## 2018-05-07 DIAGNOSIS — Z7984 Long term (current) use of oral hypoglycemic drugs: Secondary | ICD-10-CM | POA: Diagnosis not present

## 2018-05-07 DIAGNOSIS — E119 Type 2 diabetes mellitus without complications: Secondary | ICD-10-CM | POA: Diagnosis not present

## 2018-05-07 DIAGNOSIS — H524 Presbyopia: Secondary | ICD-10-CM | POA: Diagnosis not present

## 2018-05-25 DIAGNOSIS — R5383 Other fatigue: Secondary | ICD-10-CM | POA: Diagnosis not present

## 2018-05-25 DIAGNOSIS — Z23 Encounter for immunization: Secondary | ICD-10-CM | POA: Diagnosis not present

## 2018-05-25 DIAGNOSIS — R350 Frequency of micturition: Secondary | ICD-10-CM | POA: Diagnosis not present

## 2018-05-25 DIAGNOSIS — F331 Major depressive disorder, recurrent, moderate: Secondary | ICD-10-CM | POA: Diagnosis not present

## 2018-06-08 DIAGNOSIS — R350 Frequency of micturition: Secondary | ICD-10-CM | POA: Diagnosis not present

## 2018-09-15 ENCOUNTER — Other Ambulatory Visit (HOSPITAL_COMMUNITY): Payer: Self-pay | Admitting: Family Medicine

## 2018-09-15 ENCOUNTER — Ambulatory Visit (HOSPITAL_COMMUNITY)
Admission: RE | Admit: 2018-09-15 | Discharge: 2018-09-15 | Disposition: A | Discharge status: Home / Self Care | Payer: Medicare HMO | Source: Ambulatory Visit | Attending: Cardiology | Admitting: Cardiology

## 2018-09-15 DIAGNOSIS — M4316 Spondylolisthesis, lumbar region: Secondary | ICD-10-CM | POA: Diagnosis not present

## 2018-09-15 DIAGNOSIS — M79651 Pain in right thigh: Secondary | ICD-10-CM

## 2018-09-15 DIAGNOSIS — M79604 Pain in right leg: Secondary | ICD-10-CM | POA: Diagnosis not present

## 2018-09-20 ENCOUNTER — Telehealth: Payer: Self-pay

## 2018-09-20 NOTE — Telephone Encounter (Signed)
Spoke with patient on phone regarding LE venous ultrasound results.Pt verbalized understanding and was appreciative of call. She had no further questions at this time.   Pt agreed to f/u as previously planned. Encouraged pt to call with any further questions or concerns.    Results forwarded to PCP.

## 2018-09-20 NOTE — Telephone Encounter (Signed)
Patient returning call for results 

## 2018-09-20 NOTE — Telephone Encounter (Signed)
Attempted to call patient. LMTCB 1/27

## 2018-09-20 NOTE — Telephone Encounter (Signed)
-----   Message from Minna Merritts, MD sent at 09/19/2018  1:57 PM EST ----- No DVT on u/s  Can we send report to the ordering physician I have not seen the patient before I believe

## 2018-09-30 DIAGNOSIS — M4316 Spondylolisthesis, lumbar region: Secondary | ICD-10-CM | POA: Diagnosis not present

## 2018-09-30 DIAGNOSIS — M5136 Other intervertebral disc degeneration, lumbar region: Secondary | ICD-10-CM | POA: Diagnosis not present

## 2018-09-30 DIAGNOSIS — M545 Low back pain: Secondary | ICD-10-CM | POA: Diagnosis not present

## 2018-10-18 DIAGNOSIS — E1122 Type 2 diabetes mellitus with diabetic chronic kidney disease: Secondary | ICD-10-CM | POA: Diagnosis not present

## 2018-10-18 DIAGNOSIS — Z1389 Encounter for screening for other disorder: Secondary | ICD-10-CM | POA: Diagnosis not present

## 2018-10-18 DIAGNOSIS — E7800 Pure hypercholesterolemia, unspecified: Secondary | ICD-10-CM | POA: Diagnosis not present

## 2018-10-18 DIAGNOSIS — N183 Chronic kidney disease, stage 3 (moderate): Secondary | ICD-10-CM | POA: Diagnosis not present

## 2018-10-18 DIAGNOSIS — J449 Chronic obstructive pulmonary disease, unspecified: Secondary | ICD-10-CM | POA: Diagnosis not present

## 2018-10-18 DIAGNOSIS — E1151 Type 2 diabetes mellitus with diabetic peripheral angiopathy without gangrene: Secondary | ICD-10-CM | POA: Diagnosis not present

## 2018-10-18 DIAGNOSIS — I10 Essential (primary) hypertension: Secondary | ICD-10-CM | POA: Diagnosis not present

## 2018-10-18 DIAGNOSIS — Z0000 Encounter for general adult medical examination without abnormal findings: Secondary | ICD-10-CM | POA: Diagnosis not present

## 2018-10-18 DIAGNOSIS — I739 Peripheral vascular disease, unspecified: Secondary | ICD-10-CM | POA: Diagnosis not present

## 2018-10-28 DIAGNOSIS — M5136 Other intervertebral disc degeneration, lumbar region: Secondary | ICD-10-CM | POA: Diagnosis not present

## 2018-11-10 DIAGNOSIS — M5136 Other intervertebral disc degeneration, lumbar region: Secondary | ICD-10-CM | POA: Diagnosis not present

## 2018-11-10 DIAGNOSIS — M4316 Spondylolisthesis, lumbar region: Secondary | ICD-10-CM | POA: Diagnosis not present

## 2018-11-10 DIAGNOSIS — M545 Low back pain: Secondary | ICD-10-CM | POA: Diagnosis not present

## 2018-11-10 DIAGNOSIS — M48061 Spinal stenosis, lumbar region without neurogenic claudication: Secondary | ICD-10-CM | POA: Diagnosis not present

## 2019-04-19 DIAGNOSIS — N183 Chronic kidney disease, stage 3 (moderate): Secondary | ICD-10-CM | POA: Diagnosis not present

## 2019-04-19 DIAGNOSIS — I10 Essential (primary) hypertension: Secondary | ICD-10-CM | POA: Diagnosis not present

## 2019-04-19 DIAGNOSIS — E7800 Pure hypercholesterolemia, unspecified: Secondary | ICD-10-CM | POA: Diagnosis not present

## 2019-04-19 DIAGNOSIS — F331 Major depressive disorder, recurrent, moderate: Secondary | ICD-10-CM | POA: Diagnosis not present

## 2019-04-19 DIAGNOSIS — E1151 Type 2 diabetes mellitus with diabetic peripheral angiopathy without gangrene: Secondary | ICD-10-CM | POA: Diagnosis not present

## 2019-04-19 DIAGNOSIS — I739 Peripheral vascular disease, unspecified: Secondary | ICD-10-CM | POA: Diagnosis not present

## 2019-05-12 DIAGNOSIS — H524 Presbyopia: Secondary | ICD-10-CM | POA: Diagnosis not present

## 2019-05-12 DIAGNOSIS — E119 Type 2 diabetes mellitus without complications: Secondary | ICD-10-CM | POA: Diagnosis not present

## 2019-05-12 DIAGNOSIS — Z961 Presence of intraocular lens: Secondary | ICD-10-CM | POA: Diagnosis not present

## 2019-05-12 DIAGNOSIS — H52203 Unspecified astigmatism, bilateral: Secondary | ICD-10-CM | POA: Diagnosis not present

## 2019-06-16 DIAGNOSIS — F331 Major depressive disorder, recurrent, moderate: Secondary | ICD-10-CM | POA: Diagnosis not present

## 2019-06-16 DIAGNOSIS — I10 Essential (primary) hypertension: Secondary | ICD-10-CM | POA: Diagnosis not present

## 2019-06-16 DIAGNOSIS — E7800 Pure hypercholesterolemia, unspecified: Secondary | ICD-10-CM | POA: Diagnosis not present

## 2019-06-16 DIAGNOSIS — N1831 Chronic kidney disease, stage 3a: Secondary | ICD-10-CM | POA: Diagnosis not present

## 2019-06-16 DIAGNOSIS — J449 Chronic obstructive pulmonary disease, unspecified: Secondary | ICD-10-CM | POA: Diagnosis not present

## 2019-06-16 DIAGNOSIS — E1151 Type 2 diabetes mellitus with diabetic peripheral angiopathy without gangrene: Secondary | ICD-10-CM | POA: Diagnosis not present

## 2019-06-16 DIAGNOSIS — E1129 Type 2 diabetes mellitus with other diabetic kidney complication: Secondary | ICD-10-CM | POA: Diagnosis not present

## 2019-06-22 DIAGNOSIS — Z23 Encounter for immunization: Secondary | ICD-10-CM | POA: Diagnosis not present

## 2019-07-13 DIAGNOSIS — E1129 Type 2 diabetes mellitus with other diabetic kidney complication: Secondary | ICD-10-CM | POA: Diagnosis not present

## 2019-07-13 DIAGNOSIS — N1831 Chronic kidney disease, stage 3a: Secondary | ICD-10-CM | POA: Diagnosis not present

## 2019-07-13 DIAGNOSIS — F331 Major depressive disorder, recurrent, moderate: Secondary | ICD-10-CM | POA: Diagnosis not present

## 2019-07-13 DIAGNOSIS — I10 Essential (primary) hypertension: Secondary | ICD-10-CM | POA: Diagnosis not present

## 2019-07-13 DIAGNOSIS — E1151 Type 2 diabetes mellitus with diabetic peripheral angiopathy without gangrene: Secondary | ICD-10-CM | POA: Diagnosis not present

## 2019-07-13 DIAGNOSIS — E7800 Pure hypercholesterolemia, unspecified: Secondary | ICD-10-CM | POA: Diagnosis not present

## 2019-07-13 DIAGNOSIS — J449 Chronic obstructive pulmonary disease, unspecified: Secondary | ICD-10-CM | POA: Diagnosis not present

## 2019-10-17 DIAGNOSIS — E1129 Type 2 diabetes mellitus with other diabetic kidney complication: Secondary | ICD-10-CM | POA: Diagnosis not present

## 2019-10-17 DIAGNOSIS — I10 Essential (primary) hypertension: Secondary | ICD-10-CM | POA: Diagnosis not present

## 2019-10-17 DIAGNOSIS — E1151 Type 2 diabetes mellitus with diabetic peripheral angiopathy without gangrene: Secondary | ICD-10-CM | POA: Diagnosis not present

## 2019-10-17 DIAGNOSIS — N183 Chronic kidney disease, stage 3 unspecified: Secondary | ICD-10-CM | POA: Diagnosis not present

## 2019-10-17 DIAGNOSIS — F331 Major depressive disorder, recurrent, moderate: Secondary | ICD-10-CM | POA: Diagnosis not present

## 2019-10-17 DIAGNOSIS — J449 Chronic obstructive pulmonary disease, unspecified: Secondary | ICD-10-CM | POA: Diagnosis not present

## 2019-10-17 DIAGNOSIS — E7800 Pure hypercholesterolemia, unspecified: Secondary | ICD-10-CM | POA: Diagnosis not present

## 2019-10-23 ENCOUNTER — Ambulatory Visit: Payer: Medicare HMO | Attending: Internal Medicine

## 2019-10-23 ENCOUNTER — Other Ambulatory Visit: Payer: Self-pay

## 2019-10-23 DIAGNOSIS — Z23 Encounter for immunization: Secondary | ICD-10-CM

## 2019-10-23 NOTE — Progress Notes (Signed)
   Covid-19 Vaccination Clinic  Name:  Allison [name]    MRN: [id number] DOB: 1941/08/17  10/23/2019  [name] was observed post Covid-19 immunization for 15 minutes without incidence. She was provided with Vaccine Information Sheet and instruction to access the V-Safe system.   Ms. Billing was instructed to call 911 with any severe reactions post vaccine: Marland Kitchen Difficulty breathing  . Swelling of your face and throat  . A fast heartbeat  . A bad rash all over your body  . Dizziness and weakness    Immunizations Administered    Name Date Dose VIS Date Route   Pfizer COVID-19 Vaccine 10/23/2019 11:22 AM 0.3 mL 08/05/2019 Intramuscular   Manufacturer: [place]   Lot: [id number]   [place]: [id number]

## 2019-11-07 DIAGNOSIS — Z1389 Encounter for screening for other disorder: Secondary | ICD-10-CM | POA: Diagnosis not present

## 2019-11-07 DIAGNOSIS — I739 Peripheral vascular disease, unspecified: Secondary | ICD-10-CM | POA: Diagnosis not present

## 2019-11-07 DIAGNOSIS — E1151 Type 2 diabetes mellitus with diabetic peripheral angiopathy without gangrene: Secondary | ICD-10-CM | POA: Diagnosis not present

## 2019-11-07 DIAGNOSIS — F17210 Nicotine dependence, cigarettes, uncomplicated: Secondary | ICD-10-CM | POA: Diagnosis not present

## 2019-11-07 DIAGNOSIS — J449 Chronic obstructive pulmonary disease, unspecified: Secondary | ICD-10-CM | POA: Diagnosis not present

## 2019-11-07 DIAGNOSIS — F331 Major depressive disorder, recurrent, moderate: Secondary | ICD-10-CM | POA: Diagnosis not present

## 2019-11-07 DIAGNOSIS — E7800 Pure hypercholesterolemia, unspecified: Secondary | ICD-10-CM | POA: Diagnosis not present

## 2019-11-07 DIAGNOSIS — Z0000 Encounter for general adult medical examination without abnormal findings: Secondary | ICD-10-CM | POA: Diagnosis not present

## 2019-11-21 DIAGNOSIS — E7800 Pure hypercholesterolemia, unspecified: Secondary | ICD-10-CM | POA: Diagnosis not present

## 2019-11-21 DIAGNOSIS — E1151 Type 2 diabetes mellitus with diabetic peripheral angiopathy without gangrene: Secondary | ICD-10-CM | POA: Diagnosis not present

## 2019-11-21 DIAGNOSIS — N183 Chronic kidney disease, stage 3 unspecified: Secondary | ICD-10-CM | POA: Diagnosis not present

## 2019-11-21 DIAGNOSIS — E1129 Type 2 diabetes mellitus with other diabetic kidney complication: Secondary | ICD-10-CM | POA: Diagnosis not present

## 2019-11-21 DIAGNOSIS — I10 Essential (primary) hypertension: Secondary | ICD-10-CM | POA: Diagnosis not present

## 2019-11-21 DIAGNOSIS — F331 Major depressive disorder, recurrent, moderate: Secondary | ICD-10-CM | POA: Diagnosis not present

## 2019-11-21 DIAGNOSIS — J449 Chronic obstructive pulmonary disease, unspecified: Secondary | ICD-10-CM | POA: Diagnosis not present

## 2019-11-22 ENCOUNTER — Ambulatory Visit: Payer: Medicare HMO | Attending: Internal Medicine

## 2019-11-22 DIAGNOSIS — Z23 Encounter for immunization: Secondary | ICD-10-CM

## 2019-11-22 NOTE — Progress Notes (Signed)
   Covid-19 Vaccination Clinic  Name:  Allison [name]    MRN: [id number] DOB: Dec 27, 1940  11/22/2019  Ms. Love was observed post Covid-19 immunization for 15 minutes without incident. She was provided with Vaccine Information Sheet and instruction to access the V-Safe system.   [name] was instructed to call 911 with any severe reactions post vaccine: Marland Kitchen Difficulty breathing  . Swelling of face and throat  . A fast heartbeat  . A bad rash all over body  . Dizziness and weakness   Immunizations Administered    Name Date Dose VIS Date Route   Pfizer COVID-19 Vaccine 11/22/2019 10:03 AM 0.3 mL 08/05/2019 Intramuscular   Manufacturer: [place]   Lot: [id number]   [place]: [id number]

## 2020-01-12 DIAGNOSIS — E1151 Type 2 diabetes mellitus with diabetic peripheral angiopathy without gangrene: Secondary | ICD-10-CM | POA: Diagnosis not present

## 2020-01-12 DIAGNOSIS — N183 Chronic kidney disease, stage 3 unspecified: Secondary | ICD-10-CM | POA: Diagnosis not present

## 2020-01-12 DIAGNOSIS — J449 Chronic obstructive pulmonary disease, unspecified: Secondary | ICD-10-CM | POA: Diagnosis not present

## 2020-01-12 DIAGNOSIS — I10 Essential (primary) hypertension: Secondary | ICD-10-CM | POA: Diagnosis not present

## 2020-01-12 DIAGNOSIS — F331 Major depressive disorder, recurrent, moderate: Secondary | ICD-10-CM | POA: Diagnosis not present

## 2020-01-12 DIAGNOSIS — E1129 Type 2 diabetes mellitus with other diabetic kidney complication: Secondary | ICD-10-CM | POA: Diagnosis not present

## 2020-01-12 DIAGNOSIS — E7800 Pure hypercholesterolemia, unspecified: Secondary | ICD-10-CM | POA: Diagnosis not present

## 2020-02-16 DIAGNOSIS — M791 Myalgia, unspecified site: Secondary | ICD-10-CM | POA: Diagnosis not present

## 2020-02-16 DIAGNOSIS — E1151 Type 2 diabetes mellitus with diabetic peripheral angiopathy without gangrene: Secondary | ICD-10-CM | POA: Diagnosis not present

## 2020-02-22 DIAGNOSIS — J449 Chronic obstructive pulmonary disease, unspecified: Secondary | ICD-10-CM | POA: Diagnosis not present

## 2020-02-22 DIAGNOSIS — F331 Major depressive disorder, recurrent, moderate: Secondary | ICD-10-CM | POA: Diagnosis not present

## 2020-02-22 DIAGNOSIS — E1129 Type 2 diabetes mellitus with other diabetic kidney complication: Secondary | ICD-10-CM | POA: Diagnosis not present

## 2020-02-22 DIAGNOSIS — E7800 Pure hypercholesterolemia, unspecified: Secondary | ICD-10-CM | POA: Diagnosis not present

## 2020-02-22 DIAGNOSIS — N183 Chronic kidney disease, stage 3 unspecified: Secondary | ICD-10-CM | POA: Diagnosis not present

## 2020-02-22 DIAGNOSIS — E1151 Type 2 diabetes mellitus with diabetic peripheral angiopathy without gangrene: Secondary | ICD-10-CM | POA: Diagnosis not present

## 2020-02-22 DIAGNOSIS — I10 Essential (primary) hypertension: Secondary | ICD-10-CM | POA: Diagnosis not present

## 2020-03-09 DIAGNOSIS — E7800 Pure hypercholesterolemia, unspecified: Secondary | ICD-10-CM | POA: Diagnosis not present

## 2020-03-09 DIAGNOSIS — E1129 Type 2 diabetes mellitus with other diabetic kidney complication: Secondary | ICD-10-CM | POA: Diagnosis not present

## 2020-03-09 DIAGNOSIS — J449 Chronic obstructive pulmonary disease, unspecified: Secondary | ICD-10-CM | POA: Diagnosis not present

## 2020-03-09 DIAGNOSIS — I10 Essential (primary) hypertension: Secondary | ICD-10-CM | POA: Diagnosis not present

## 2020-03-09 DIAGNOSIS — F331 Major depressive disorder, recurrent, moderate: Secondary | ICD-10-CM | POA: Diagnosis not present

## 2020-03-09 DIAGNOSIS — N183 Chronic kidney disease, stage 3 unspecified: Secondary | ICD-10-CM | POA: Diagnosis not present

## 2020-03-09 DIAGNOSIS — E1151 Type 2 diabetes mellitus with diabetic peripheral angiopathy without gangrene: Secondary | ICD-10-CM | POA: Diagnosis not present

## 2020-04-19 DIAGNOSIS — I10 Essential (primary) hypertension: Secondary | ICD-10-CM | POA: Diagnosis not present

## 2020-04-19 DIAGNOSIS — F331 Major depressive disorder, recurrent, moderate: Secondary | ICD-10-CM | POA: Diagnosis not present

## 2020-04-19 DIAGNOSIS — E1129 Type 2 diabetes mellitus with other diabetic kidney complication: Secondary | ICD-10-CM | POA: Diagnosis not present

## 2020-04-19 DIAGNOSIS — E1151 Type 2 diabetes mellitus with diabetic peripheral angiopathy without gangrene: Secondary | ICD-10-CM | POA: Diagnosis not present

## 2020-04-19 DIAGNOSIS — N183 Chronic kidney disease, stage 3 unspecified: Secondary | ICD-10-CM | POA: Diagnosis not present

## 2020-04-19 DIAGNOSIS — J449 Chronic obstructive pulmonary disease, unspecified: Secondary | ICD-10-CM | POA: Diagnosis not present

## 2020-04-19 DIAGNOSIS — E7800 Pure hypercholesterolemia, unspecified: Secondary | ICD-10-CM | POA: Diagnosis not present

## 2020-05-10 DIAGNOSIS — J449 Chronic obstructive pulmonary disease, unspecified: Secondary | ICD-10-CM | POA: Diagnosis not present

## 2020-05-10 DIAGNOSIS — R0600 Dyspnea, unspecified: Secondary | ICD-10-CM | POA: Diagnosis not present

## 2020-05-10 DIAGNOSIS — I739 Peripheral vascular disease, unspecified: Secondary | ICD-10-CM | POA: Diagnosis not present

## 2020-05-10 DIAGNOSIS — F17210 Nicotine dependence, cigarettes, uncomplicated: Secondary | ICD-10-CM | POA: Diagnosis not present

## 2020-05-10 DIAGNOSIS — E1129 Type 2 diabetes mellitus with other diabetic kidney complication: Secondary | ICD-10-CM | POA: Diagnosis not present

## 2020-05-10 DIAGNOSIS — E1151 Type 2 diabetes mellitus with diabetic peripheral angiopathy without gangrene: Secondary | ICD-10-CM | POA: Diagnosis not present

## 2020-05-10 DIAGNOSIS — N1831 Chronic kidney disease, stage 3a: Secondary | ICD-10-CM | POA: Diagnosis not present

## 2020-05-10 DIAGNOSIS — E7800 Pure hypercholesterolemia, unspecified: Secondary | ICD-10-CM | POA: Diagnosis not present

## 2020-05-10 DIAGNOSIS — I10 Essential (primary) hypertension: Secondary | ICD-10-CM | POA: Diagnosis not present

## 2020-05-21 DIAGNOSIS — E119 Type 2 diabetes mellitus without complications: Secondary | ICD-10-CM | POA: Diagnosis not present

## 2020-05-21 DIAGNOSIS — H524 Presbyopia: Secondary | ICD-10-CM | POA: Diagnosis not present

## 2020-05-21 DIAGNOSIS — Z961 Presence of intraocular lens: Secondary | ICD-10-CM | POA: Diagnosis not present

## 2020-05-21 DIAGNOSIS — H52203 Unspecified astigmatism, bilateral: Secondary | ICD-10-CM | POA: Diagnosis not present

## 2020-05-22 DIAGNOSIS — J449 Chronic obstructive pulmonary disease, unspecified: Secondary | ICD-10-CM | POA: Diagnosis not present

## 2020-05-22 DIAGNOSIS — E1151 Type 2 diabetes mellitus with diabetic peripheral angiopathy without gangrene: Secondary | ICD-10-CM | POA: Diagnosis not present

## 2020-05-22 DIAGNOSIS — E1129 Type 2 diabetes mellitus with other diabetic kidney complication: Secondary | ICD-10-CM | POA: Diagnosis not present

## 2020-05-22 DIAGNOSIS — F331 Major depressive disorder, recurrent, moderate: Secondary | ICD-10-CM | POA: Diagnosis not present

## 2020-05-22 DIAGNOSIS — E7800 Pure hypercholesterolemia, unspecified: Secondary | ICD-10-CM | POA: Diagnosis not present

## 2020-05-22 DIAGNOSIS — N183 Chronic kidney disease, stage 3 unspecified: Secondary | ICD-10-CM | POA: Diagnosis not present

## 2020-05-22 DIAGNOSIS — N1831 Chronic kidney disease, stage 3a: Secondary | ICD-10-CM | POA: Diagnosis not present

## 2020-05-22 DIAGNOSIS — I10 Essential (primary) hypertension: Secondary | ICD-10-CM | POA: Diagnosis not present

## 2020-06-20 DIAGNOSIS — Z23 Encounter for immunization: Secondary | ICD-10-CM | POA: Diagnosis not present

## 2020-06-22 DIAGNOSIS — I10 Essential (primary) hypertension: Secondary | ICD-10-CM | POA: Diagnosis not present

## 2020-06-22 DIAGNOSIS — E1151 Type 2 diabetes mellitus with diabetic peripheral angiopathy without gangrene: Secondary | ICD-10-CM | POA: Diagnosis not present

## 2020-06-22 DIAGNOSIS — N1831 Chronic kidney disease, stage 3a: Secondary | ICD-10-CM | POA: Diagnosis not present

## 2020-06-22 DIAGNOSIS — N183 Chronic kidney disease, stage 3 unspecified: Secondary | ICD-10-CM | POA: Diagnosis not present

## 2020-06-22 DIAGNOSIS — F331 Major depressive disorder, recurrent, moderate: Secondary | ICD-10-CM | POA: Diagnosis not present

## 2020-06-22 DIAGNOSIS — E1129 Type 2 diabetes mellitus with other diabetic kidney complication: Secondary | ICD-10-CM | POA: Diagnosis not present

## 2020-06-22 DIAGNOSIS — J449 Chronic obstructive pulmonary disease, unspecified: Secondary | ICD-10-CM | POA: Diagnosis not present

## 2020-06-22 DIAGNOSIS — E7800 Pure hypercholesterolemia, unspecified: Secondary | ICD-10-CM | POA: Diagnosis not present

## 2020-07-24 DIAGNOSIS — E1129 Type 2 diabetes mellitus with other diabetic kidney complication: Secondary | ICD-10-CM | POA: Diagnosis not present

## 2020-07-24 DIAGNOSIS — J449 Chronic obstructive pulmonary disease, unspecified: Secondary | ICD-10-CM | POA: Diagnosis not present

## 2020-07-24 DIAGNOSIS — E7800 Pure hypercholesterolemia, unspecified: Secondary | ICD-10-CM | POA: Diagnosis not present

## 2020-07-24 DIAGNOSIS — I10 Essential (primary) hypertension: Secondary | ICD-10-CM | POA: Diagnosis not present

## 2020-07-24 DIAGNOSIS — N1831 Chronic kidney disease, stage 3a: Secondary | ICD-10-CM | POA: Diagnosis not present

## 2020-07-24 DIAGNOSIS — N183 Chronic kidney disease, stage 3 unspecified: Secondary | ICD-10-CM | POA: Diagnosis not present

## 2020-07-24 DIAGNOSIS — E1151 Type 2 diabetes mellitus with diabetic peripheral angiopathy without gangrene: Secondary | ICD-10-CM | POA: Diagnosis not present

## 2020-07-24 DIAGNOSIS — F331 Major depressive disorder, recurrent, moderate: Secondary | ICD-10-CM | POA: Diagnosis not present

## 2020-08-08 DIAGNOSIS — F331 Major depressive disorder, recurrent, moderate: Secondary | ICD-10-CM | POA: Diagnosis not present

## 2020-08-08 DIAGNOSIS — I10 Essential (primary) hypertension: Secondary | ICD-10-CM | POA: Diagnosis not present

## 2020-08-08 DIAGNOSIS — N1831 Chronic kidney disease, stage 3a: Secondary | ICD-10-CM | POA: Diagnosis not present

## 2020-08-08 DIAGNOSIS — N183 Chronic kidney disease, stage 3 unspecified: Secondary | ICD-10-CM | POA: Diagnosis not present

## 2020-08-08 DIAGNOSIS — E7800 Pure hypercholesterolemia, unspecified: Secondary | ICD-10-CM | POA: Diagnosis not present

## 2020-08-08 DIAGNOSIS — E1129 Type 2 diabetes mellitus with other diabetic kidney complication: Secondary | ICD-10-CM | POA: Diagnosis not present

## 2020-08-08 DIAGNOSIS — E1151 Type 2 diabetes mellitus with diabetic peripheral angiopathy without gangrene: Secondary | ICD-10-CM | POA: Diagnosis not present

## 2020-08-08 DIAGNOSIS — J449 Chronic obstructive pulmonary disease, unspecified: Secondary | ICD-10-CM | POA: Diagnosis not present

## 2020-08-16 DIAGNOSIS — F32A Depression, unspecified: Secondary | ICD-10-CM | POA: Diagnosis not present

## 2020-08-16 DIAGNOSIS — F419 Anxiety disorder, unspecified: Secondary | ICD-10-CM | POA: Diagnosis not present

## 2020-10-03 DIAGNOSIS — I10 Essential (primary) hypertension: Secondary | ICD-10-CM | POA: Diagnosis not present

## 2020-10-03 DIAGNOSIS — E1151 Type 2 diabetes mellitus with diabetic peripheral angiopathy without gangrene: Secondary | ICD-10-CM | POA: Diagnosis not present

## 2020-10-03 DIAGNOSIS — N1831 Chronic kidney disease, stage 3a: Secondary | ICD-10-CM | POA: Diagnosis not present

## 2020-10-03 DIAGNOSIS — F331 Major depressive disorder, recurrent, moderate: Secondary | ICD-10-CM | POA: Diagnosis not present

## 2020-10-03 DIAGNOSIS — E7800 Pure hypercholesterolemia, unspecified: Secondary | ICD-10-CM | POA: Diagnosis not present

## 2020-10-03 DIAGNOSIS — J449 Chronic obstructive pulmonary disease, unspecified: Secondary | ICD-10-CM | POA: Diagnosis not present

## 2020-10-03 DIAGNOSIS — E1129 Type 2 diabetes mellitus with other diabetic kidney complication: Secondary | ICD-10-CM | POA: Diagnosis not present

## 2020-11-08 DIAGNOSIS — E1151 Type 2 diabetes mellitus with diabetic peripheral angiopathy without gangrene: Secondary | ICD-10-CM | POA: Diagnosis not present

## 2020-11-08 DIAGNOSIS — E7800 Pure hypercholesterolemia, unspecified: Secondary | ICD-10-CM | POA: Diagnosis not present

## 2020-11-08 DIAGNOSIS — E1129 Type 2 diabetes mellitus with other diabetic kidney complication: Secondary | ICD-10-CM | POA: Diagnosis not present

## 2020-11-08 DIAGNOSIS — F331 Major depressive disorder, recurrent, moderate: Secondary | ICD-10-CM | POA: Diagnosis not present

## 2020-11-08 DIAGNOSIS — J449 Chronic obstructive pulmonary disease, unspecified: Secondary | ICD-10-CM | POA: Diagnosis not present

## 2020-11-08 DIAGNOSIS — I10 Essential (primary) hypertension: Secondary | ICD-10-CM | POA: Diagnosis not present

## 2020-11-08 DIAGNOSIS — E1122 Type 2 diabetes mellitus with diabetic chronic kidney disease: Secondary | ICD-10-CM | POA: Diagnosis not present

## 2020-11-08 DIAGNOSIS — I739 Peripheral vascular disease, unspecified: Secondary | ICD-10-CM | POA: Diagnosis not present

## 2020-11-08 DIAGNOSIS — N1831 Chronic kidney disease, stage 3a: Secondary | ICD-10-CM | POA: Diagnosis not present

## 2020-11-08 DIAGNOSIS — F17210 Nicotine dependence, cigarettes, uncomplicated: Secondary | ICD-10-CM | POA: Diagnosis not present

## 2020-11-16 DIAGNOSIS — E7800 Pure hypercholesterolemia, unspecified: Secondary | ICD-10-CM | POA: Diagnosis not present

## 2020-11-16 DIAGNOSIS — F331 Major depressive disorder, recurrent, moderate: Secondary | ICD-10-CM | POA: Diagnosis not present

## 2020-11-16 DIAGNOSIS — I10 Essential (primary) hypertension: Secondary | ICD-10-CM | POA: Diagnosis not present

## 2020-11-16 DIAGNOSIS — E1129 Type 2 diabetes mellitus with other diabetic kidney complication: Secondary | ICD-10-CM | POA: Diagnosis not present

## 2020-11-16 DIAGNOSIS — N1831 Chronic kidney disease, stage 3a: Secondary | ICD-10-CM | POA: Diagnosis not present

## 2020-11-16 DIAGNOSIS — N183 Chronic kidney disease, stage 3 unspecified: Secondary | ICD-10-CM | POA: Diagnosis not present

## 2020-11-16 DIAGNOSIS — E1151 Type 2 diabetes mellitus with diabetic peripheral angiopathy without gangrene: Secondary | ICD-10-CM | POA: Diagnosis not present

## 2020-11-16 DIAGNOSIS — J449 Chronic obstructive pulmonary disease, unspecified: Secondary | ICD-10-CM | POA: Diagnosis not present

## 2021-01-01 DIAGNOSIS — N183 Chronic kidney disease, stage 3 unspecified: Secondary | ICD-10-CM | POA: Diagnosis not present

## 2021-01-01 DIAGNOSIS — I10 Essential (primary) hypertension: Secondary | ICD-10-CM | POA: Diagnosis not present

## 2021-01-01 DIAGNOSIS — F331 Major depressive disorder, recurrent, moderate: Secondary | ICD-10-CM | POA: Diagnosis not present

## 2021-01-01 DIAGNOSIS — N1831 Chronic kidney disease, stage 3a: Secondary | ICD-10-CM | POA: Diagnosis not present

## 2021-01-01 DIAGNOSIS — E1151 Type 2 diabetes mellitus with diabetic peripheral angiopathy without gangrene: Secondary | ICD-10-CM | POA: Diagnosis not present

## 2021-01-01 DIAGNOSIS — E1129 Type 2 diabetes mellitus with other diabetic kidney complication: Secondary | ICD-10-CM | POA: Diagnosis not present

## 2021-01-01 DIAGNOSIS — E7800 Pure hypercholesterolemia, unspecified: Secondary | ICD-10-CM | POA: Diagnosis not present

## 2021-01-01 DIAGNOSIS — J449 Chronic obstructive pulmonary disease, unspecified: Secondary | ICD-10-CM | POA: Diagnosis not present

## 2021-03-08 DIAGNOSIS — E1129 Type 2 diabetes mellitus with other diabetic kidney complication: Secondary | ICD-10-CM | POA: Diagnosis not present

## 2021-03-08 DIAGNOSIS — J449 Chronic obstructive pulmonary disease, unspecified: Secondary | ICD-10-CM | POA: Diagnosis not present

## 2021-03-08 DIAGNOSIS — N183 Chronic kidney disease, stage 3 unspecified: Secondary | ICD-10-CM | POA: Diagnosis not present

## 2021-03-08 DIAGNOSIS — I10 Essential (primary) hypertension: Secondary | ICD-10-CM | POA: Diagnosis not present

## 2021-03-08 DIAGNOSIS — F331 Major depressive disorder, recurrent, moderate: Secondary | ICD-10-CM | POA: Diagnosis not present

## 2021-03-08 DIAGNOSIS — E7800 Pure hypercholesterolemia, unspecified: Secondary | ICD-10-CM | POA: Diagnosis not present

## 2021-03-08 DIAGNOSIS — E1151 Type 2 diabetes mellitus with diabetic peripheral angiopathy without gangrene: Secondary | ICD-10-CM | POA: Diagnosis not present

## 2021-04-01 ENCOUNTER — Other Ambulatory Visit: Payer: Self-pay | Admitting: Internal Medicine

## 2021-04-01 ENCOUNTER — Ambulatory Visit
Admission: RE | Admit: 2021-04-01 | Discharge: 2021-04-01 | Disposition: A | Discharge status: Home / Self Care | Payer: Medicare HMO | Source: Ambulatory Visit | Attending: Internal Medicine | Admitting: Internal Medicine

## 2021-04-01 DIAGNOSIS — R42 Dizziness and giddiness: Secondary | ICD-10-CM | POA: Diagnosis not present

## 2021-04-01 DIAGNOSIS — W19XXXA Unspecified fall, initial encounter: Secondary | ICD-10-CM | POA: Diagnosis not present

## 2021-04-01 DIAGNOSIS — R634 Abnormal weight loss: Secondary | ICD-10-CM

## 2021-04-04 ENCOUNTER — Other Ambulatory Visit: Payer: Self-pay | Admitting: Internal Medicine

## 2021-04-04 DIAGNOSIS — R42 Dizziness and giddiness: Secondary | ICD-10-CM

## 2021-04-04 DIAGNOSIS — W19XXXA Unspecified fall, initial encounter: Secondary | ICD-10-CM

## 2021-04-25 ENCOUNTER — Other Ambulatory Visit: Payer: Self-pay

## 2021-04-25 ENCOUNTER — Ambulatory Visit
Admission: RE | Admit: 2021-04-25 | Discharge: 2021-04-25 | Disposition: A | Discharge status: Home / Self Care | Payer: Medicare HMO | Source: Ambulatory Visit | Attending: Internal Medicine | Admitting: Internal Medicine

## 2021-04-25 DIAGNOSIS — R42 Dizziness and giddiness: Secondary | ICD-10-CM | POA: Diagnosis not present

## 2021-04-25 DIAGNOSIS — W19XXXA Unspecified fall, initial encounter: Secondary | ICD-10-CM

## 2021-04-25 DIAGNOSIS — R55 Syncope and collapse: Secondary | ICD-10-CM | POA: Diagnosis not present

## 2021-05-15 DIAGNOSIS — I739 Peripheral vascular disease, unspecified: Secondary | ICD-10-CM | POA: Diagnosis not present

## 2021-05-15 DIAGNOSIS — E7800 Pure hypercholesterolemia, unspecified: Secondary | ICD-10-CM | POA: Diagnosis not present

## 2021-05-15 DIAGNOSIS — Z7984 Long term (current) use of oral hypoglycemic drugs: Secondary | ICD-10-CM | POA: Diagnosis not present

## 2021-05-15 DIAGNOSIS — F17210 Nicotine dependence, cigarettes, uncomplicated: Secondary | ICD-10-CM | POA: Diagnosis not present

## 2021-05-15 DIAGNOSIS — I10 Essential (primary) hypertension: Secondary | ICD-10-CM | POA: Diagnosis not present

## 2021-05-15 DIAGNOSIS — E1129 Type 2 diabetes mellitus with other diabetic kidney complication: Secondary | ICD-10-CM | POA: Diagnosis not present

## 2021-05-15 DIAGNOSIS — J449 Chronic obstructive pulmonary disease, unspecified: Secondary | ICD-10-CM | POA: Diagnosis not present

## 2021-05-15 DIAGNOSIS — E1151 Type 2 diabetes mellitus with diabetic peripheral angiopathy without gangrene: Secondary | ICD-10-CM | POA: Diagnosis not present

## 2021-05-15 DIAGNOSIS — N1831 Chronic kidney disease, stage 3a: Secondary | ICD-10-CM | POA: Diagnosis not present

## 2021-06-06 DIAGNOSIS — Z23 Encounter for immunization: Secondary | ICD-10-CM | POA: Diagnosis not present

## 2021-06-13 DIAGNOSIS — H52203 Unspecified astigmatism, bilateral: Secondary | ICD-10-CM | POA: Diagnosis not present

## 2021-06-13 DIAGNOSIS — H524 Presbyopia: Secondary | ICD-10-CM | POA: Diagnosis not present

## 2021-06-13 DIAGNOSIS — Z961 Presence of intraocular lens: Secondary | ICD-10-CM | POA: Diagnosis not present

## 2021-06-13 DIAGNOSIS — E119 Type 2 diabetes mellitus without complications: Secondary | ICD-10-CM | POA: Diagnosis not present

## 2021-07-25 DIAGNOSIS — E1129 Type 2 diabetes mellitus with other diabetic kidney complication: Secondary | ICD-10-CM | POA: Diagnosis not present

## 2021-07-25 DIAGNOSIS — I10 Essential (primary) hypertension: Secondary | ICD-10-CM | POA: Diagnosis not present

## 2021-07-25 DIAGNOSIS — E7800 Pure hypercholesterolemia, unspecified: Secondary | ICD-10-CM | POA: Diagnosis not present

## 2021-07-25 DIAGNOSIS — E1151 Type 2 diabetes mellitus with diabetic peripheral angiopathy without gangrene: Secondary | ICD-10-CM | POA: Diagnosis not present

## 2021-07-25 DIAGNOSIS — N1831 Chronic kidney disease, stage 3a: Secondary | ICD-10-CM | POA: Diagnosis not present

## 2021-07-25 DIAGNOSIS — J449 Chronic obstructive pulmonary disease, unspecified: Secondary | ICD-10-CM | POA: Diagnosis not present

## 2021-09-04 DIAGNOSIS — R399 Unspecified symptoms and signs involving the genitourinary system: Secondary | ICD-10-CM | POA: Diagnosis not present

## 2021-09-04 DIAGNOSIS — N3941 Urge incontinence: Secondary | ICD-10-CM | POA: Diagnosis not present

## 2021-09-27 DIAGNOSIS — E119 Type 2 diabetes mellitus without complications: Secondary | ICD-10-CM | POA: Diagnosis not present

## 2021-09-27 DIAGNOSIS — M48061 Spinal stenosis, lumbar region without neurogenic claudication: Secondary | ICD-10-CM | POA: Diagnosis not present

## 2021-09-27 DIAGNOSIS — R800 Isolated proteinuria: Secondary | ICD-10-CM | POA: Diagnosis not present

## 2021-09-27 DIAGNOSIS — N3946 Mixed incontinence: Secondary | ICD-10-CM | POA: Diagnosis not present

## 2021-09-27 DIAGNOSIS — H524 Presbyopia: Secondary | ICD-10-CM | POA: Diagnosis not present

## 2021-09-27 DIAGNOSIS — Z720 Tobacco use: Secondary | ICD-10-CM | POA: Diagnosis not present

## 2021-10-23 DIAGNOSIS — J449 Chronic obstructive pulmonary disease, unspecified: Secondary | ICD-10-CM | POA: Diagnosis not present

## 2021-10-23 DIAGNOSIS — N3281 Overactive bladder: Secondary | ICD-10-CM | POA: Diagnosis not present

## 2021-10-23 DIAGNOSIS — N1832 Chronic kidney disease, stage 3b: Secondary | ICD-10-CM | POA: Diagnosis not present

## 2021-10-23 DIAGNOSIS — I739 Peripheral vascular disease, unspecified: Secondary | ICD-10-CM | POA: Diagnosis not present

## 2021-10-23 DIAGNOSIS — I10 Essential (primary) hypertension: Secondary | ICD-10-CM | POA: Diagnosis not present

## 2021-10-23 DIAGNOSIS — R32 Unspecified urinary incontinence: Secondary | ICD-10-CM | POA: Diagnosis not present

## 2021-10-23 DIAGNOSIS — E7800 Pure hypercholesterolemia, unspecified: Secondary | ICD-10-CM | POA: Diagnosis not present

## 2021-10-23 DIAGNOSIS — E1151 Type 2 diabetes mellitus with diabetic peripheral angiopathy without gangrene: Secondary | ICD-10-CM | POA: Diagnosis not present

## 2022-01-24 DIAGNOSIS — I739 Peripheral vascular disease, unspecified: Secondary | ICD-10-CM | POA: Diagnosis not present

## 2022-01-24 DIAGNOSIS — E7800 Pure hypercholesterolemia, unspecified: Secondary | ICD-10-CM | POA: Diagnosis not present

## 2022-01-24 DIAGNOSIS — E1151 Type 2 diabetes mellitus with diabetic peripheral angiopathy without gangrene: Secondary | ICD-10-CM | POA: Diagnosis not present

## 2022-01-24 DIAGNOSIS — N3281 Overactive bladder: Secondary | ICD-10-CM | POA: Diagnosis not present

## 2022-01-24 DIAGNOSIS — N1831 Chronic kidney disease, stage 3a: Secondary | ICD-10-CM | POA: Diagnosis not present

## 2022-01-24 DIAGNOSIS — J449 Chronic obstructive pulmonary disease, unspecified: Secondary | ICD-10-CM | POA: Diagnosis not present

## 2022-01-24 DIAGNOSIS — E1129 Type 2 diabetes mellitus with other diabetic kidney complication: Secondary | ICD-10-CM | POA: Diagnosis not present

## 2022-01-24 DIAGNOSIS — N3941 Urge incontinence: Secondary | ICD-10-CM | POA: Diagnosis not present

## 2022-01-24 DIAGNOSIS — I10 Essential (primary) hypertension: Secondary | ICD-10-CM | POA: Diagnosis not present

## 2022-03-14 DIAGNOSIS — R200 Anesthesia of skin: Secondary | ICD-10-CM | POA: Diagnosis not present

## 2022-04-02 DIAGNOSIS — G5601 Carpal tunnel syndrome, right upper limb: Secondary | ICD-10-CM | POA: Diagnosis not present

## 2022-04-11 DIAGNOSIS — G5601 Carpal tunnel syndrome, right upper limb: Secondary | ICD-10-CM | POA: Diagnosis not present

## 2022-04-25 DIAGNOSIS — G5601 Carpal tunnel syndrome, right upper limb: Secondary | ICD-10-CM | POA: Diagnosis not present

## 2022-05-02 DIAGNOSIS — Z4789 Encounter for other orthopedic aftercare: Secondary | ICD-10-CM | POA: Diagnosis not present

## 2022-05-02 DIAGNOSIS — G5601 Carpal tunnel syndrome, right upper limb: Secondary | ICD-10-CM | POA: Diagnosis not present

## 2022-05-10 ENCOUNTER — Other Ambulatory Visit: Payer: Self-pay

## 2022-05-10 ENCOUNTER — Emergency Department (HOSPITAL_COMMUNITY)
Admission: EM | Admit: 2022-05-10 | Discharge: 2022-05-10 | Disposition: A | Discharge status: Home / Self Care | Payer: Medicare HMO | Attending: Emergency Medicine | Admitting: Emergency Medicine

## 2022-05-10 ENCOUNTER — Encounter (HOSPITAL_COMMUNITY): Payer: Self-pay

## 2022-05-10 ENCOUNTER — Emergency Department (HOSPITAL_COMMUNITY): Payer: Medicare HMO

## 2022-05-10 DIAGNOSIS — R42 Dizziness and giddiness: Secondary | ICD-10-CM | POA: Diagnosis not present

## 2022-05-10 DIAGNOSIS — M546 Pain in thoracic spine: Secondary | ICD-10-CM | POA: Insufficient documentation

## 2022-05-10 DIAGNOSIS — R079 Chest pain, unspecified: Secondary | ICD-10-CM | POA: Diagnosis not present

## 2022-05-10 DIAGNOSIS — J449 Chronic obstructive pulmonary disease, unspecified: Secondary | ICD-10-CM | POA: Diagnosis not present

## 2022-05-10 DIAGNOSIS — J439 Emphysema, unspecified: Secondary | ICD-10-CM | POA: Diagnosis not present

## 2022-05-10 DIAGNOSIS — E119 Type 2 diabetes mellitus without complications: Secondary | ICD-10-CM | POA: Insufficient documentation

## 2022-05-10 DIAGNOSIS — I10 Essential (primary) hypertension: Secondary | ICD-10-CM | POA: Diagnosis not present

## 2022-05-10 DIAGNOSIS — I2510 Atherosclerotic heart disease of native coronary artery without angina pectoris: Secondary | ICD-10-CM | POA: Insufficient documentation

## 2022-05-10 DIAGNOSIS — Z7982 Long term (current) use of aspirin: Secondary | ICD-10-CM | POA: Diagnosis not present

## 2022-05-10 DIAGNOSIS — G4489 Other headache syndrome: Secondary | ICD-10-CM | POA: Diagnosis not present

## 2022-05-10 DIAGNOSIS — M549 Dorsalgia, unspecified: Secondary | ICD-10-CM

## 2022-05-10 HISTORY — DX: Chronic obstructive pulmonary disease, unspecified: J44.9

## 2022-05-10 LAB — CBC WITH DIFFERENTIAL/PLATELET
Abs Immature Granulocytes: 0 10*3/uL (ref 0.00–0.07)
Basophils Absolute: 0 10*3/uL (ref 0.0–0.1)
Basophils Relative: 0 %
Eosinophils Absolute: 0 10*3/uL (ref 0.0–0.5)
Eosinophils Relative: 0 %
HCT: 42.5 % (ref 36.0–46.0)
Hemoglobin: 14.5 g/dL (ref 12.0–15.0)
Lymphocytes Relative: 7 %
Lymphs Abs: 0.6 10*3/uL — ABNORMAL LOW (ref 0.7–4.0)
MCH: 29.4 pg (ref 26.0–34.0)
MCHC: 34.1 g/dL (ref 30.0–36.0)
MCV: 86.2 fL (ref 80.0–100.0)
Monocytes Absolute: 0.2 10*3/uL (ref 0.1–1.0)
Monocytes Relative: 2 %
Neutro Abs: 8.4 10*3/uL — ABNORMAL HIGH (ref 1.7–7.7)
Neutrophils Relative %: 91 %
Platelets: 232 10*3/uL (ref 150–400)
RBC: 4.93 MIL/uL (ref 3.87–5.11)
RDW: 13.2 % (ref 11.5–15.5)
WBC: 9.2 10*3/uL (ref 4.0–10.5)
nRBC: 0 % (ref 0.0–0.2)

## 2022-05-10 LAB — COMPREHENSIVE METABOLIC PANEL
ALT: 18 U/L (ref 0–44)
AST: 23 U/L (ref 15–41)
Albumin: 3.4 g/dL — ABNORMAL LOW (ref 3.5–5.0)
Alkaline Phosphatase: 82 U/L (ref 38–126)
Anion gap: 7 (ref 5–15)
BUN: 20 mg/dL (ref 8–23)
CO2: 26 mmol/L (ref 22–32)
Calcium: 9.1 mg/dL (ref 8.9–10.3)
Chloride: 103 mmol/L (ref 98–111)
Creatinine, Ser: 0.87 mg/dL (ref 0.44–1.00)
GFR, Estimated: >60 mL/min (ref >60)
Glucose, Bld: 138 mg/dL — ABNORMAL HIGH (ref 70–99)
Potassium: 4.1 mmol/L (ref 3.5–5.1)
Sodium: 136 mmol/L (ref 135–145)
Total Bilirubin: 0.6 mg/dL (ref 0.3–1.2)
Total Protein: 7.3 g/dL (ref 6.5–8.1)

## 2022-05-10 LAB — TROPONIN I (HIGH SENSITIVITY)
Troponin I (High Sensitivity): 10 ng/L (ref <18)
Troponin I (High Sensitivity): 11 ng/L (ref <18)

## 2022-05-10 MED ORDER — LIDOCAINE 5 % EX PTCH: 1.0000 | MEDICATED_PATCH | CUTANEOUS | 0 refills | Status: DC

## 2022-05-10 MED ORDER — MORPHINE SULFATE (PF) 4 MG/ML IV SOLN
4.0000 mg | Freq: Once | INTRAVENOUS | Status: AC
  Administered 2022-05-10: 4 mg via INTRAVENOUS
  Filled 2022-05-10: qty 1

## 2022-05-10 MED ORDER — LIDOCAINE 5 % EX PTCH
1.0000 | MEDICATED_PATCH | Freq: Once | CUTANEOUS | Status: DC
  Administered 2022-05-10: 1 via TRANSDERMAL
  Filled 2022-05-10: qty 1

## 2022-05-10 NOTE — ED Provider Notes (Signed)
[facility] DEPT Provider Note   CSN: [id number] Arrival date & time: 05/10/22  1626     History  Chief Complaint  Patient presents with   Back Pain    Allison [name] is a 81 y.o. female.  81 year old female with history of coronary artery disease, hypertension, COPD, type 2 diabetes mellitus presented to emergency department with complaints of upper back pain. Patient complaining of left upper back pain which started last night.  Pain is constant, non radiating.pain worsens with movement of the upper body and bending forward.  No associated symptom of chest pain, shortness of breath, nausea, vomiting, sweating.  Patient unchanged with deep breathing or coughing.  Patient reports pain got worse this morning.  Patient took 2 500 mg of Tylenol.  Patient unsure if it helped with the pain.  Patient appears in mild distress secondary to pain.  Her blood pressure is slightly elevated.  The history is provided by the patient. No language interpreter was used.  Back Pain Pain location: Left upper back. Quality:  Aching Radiates to:  Does not radiate Pain severity:  Severe Onset quality:  Sudden Duration:  1 day Timing:  Constant      Home Medications Prior to Admission medications   Medication Sig Start Date End Date Taking? Authorizing Provider  lidocaine (LIDODERM) 5 % Place 1 patch onto the skin daily. Remove & Discard patch within 12 hours or as directed by MD 05/10/22  Yes Drenda Freeze, MD  Amlodipine-Olmesartan (AZOR PO) Take by mouth.    [provider]  aspirin 81 MG tablet Take 81 mg by mouth daily.    [provider]  atorvastatin (LIPITOR) 40 MG tablet daily. 04/23/12   [provider]  hydrochlorothiazide (HYDRODIURIL) 25 MG tablet Take 25 mg by mouth daily.    [provider]  JANUMET 50-500 MG tablet TK 1 T PO QAM WITH A MEAL 07/19/15   [provider]  lisinopril (PRINIVIL,ZESTRIL) 40 MG  tablet Take 40 mg by mouth daily.    [provider]  Multiple Vitamin (MULTIVITAMIN) tablet Take 1 tablet by mouth daily.    [provider]  naproxen sodium (ANAPROX) 220 MG tablet Take 220 mg by mouth 2 (two) times daily with a meal.    [provider]  rosuvastatin (CRESTOR) 20 MG tablet Take 20 mg by mouth daily.    [provider]      Allergies    Cefaclor, Minocycline hcl, Nitrofurantoin, and Sucralfate    Review of Systems   Review of Systems  Musculoskeletal:  Positive for back pain.    Physical Exam Updated Vital Signs BP (!) 146/76   Pulse 71   Temp 98.9 F (37.2 C) (Oral)   Resp (!) 22   SpO2 96%  Physical Exam Vitals and nursing note reviewed.  Constitutional:      Appearance: Normal appearance.  HENT:     Head: Normocephalic and atraumatic.  Eyes:     Conjunctiva/sclera: Conjunctivae normal.  Cardiovascular:     Rate and Rhythm: Normal rate and regular rhythm.     Heart sounds: No murmur heard.    No friction rub. No gallop.  Pulmonary:     Effort: Pulmonary effort is normal. No respiratory distress.     Breath sounds: Normal breath sounds. No stridor. No wheezing, rhonchi or rales.  Chest:     Chest wall: No tenderness.  Abdominal:     General: Bowel sounds are normal.  Tenderness: There is no abdominal tenderness.  Musculoskeletal:        General: Tenderness (Tenderness to palpation to left scapular area.) present.       Arms:     Cervical back: Normal range of motion and neck supple. No rigidity.     Thoracic back: Tenderness and bony tenderness present. No signs of trauma. Normal range of motion.       Back:     Comments: Pain   Skin:    Findings: No erythema or rash.  Neurological:     Mental Status: She is alert and oriented to person, place, and time.     ED Results / Procedures / Treatments   Labs (all labs ordered are listed, but only abnormal results are displayed) Labs Reviewed  CBC WITH  DIFFERENTIAL/PLATELET - Abnormal; Notable for the following components:      Result Value   Neutro Abs 8.4 (*)    Lymphs Abs 0.6 (*)    All other components within normal limits  COMPREHENSIVE METABOLIC PANEL - Abnormal; Notable for the following components:   Glucose, Bld 138 (*)    Albumin 3.4 (*)    All other components within normal limits  TROPONIN I (HIGH SENSITIVITY)  TROPONIN I (HIGH SENSITIVITY)    EKG EKG Interpretation  Date/Time:  Saturday May 10 2022 16:56:32 EDT Ventricular Rate:  70 PR Interval:  142 QRS Duration: 71 QT Interval:  383 QTC Calculation: 414 R Axis:   34 Text Interpretation: Age not entered, assumed to be  81 years old for purpose of ECG interpretation Sinus rhythm Borderline low voltage, extremity leads Anteroseptal infarct, old No significant change since last tracing Confirmed by Wandra Arthurs [phone]) on 05/10/2022 5:21:30 PM  Radiology DG Chest 2 View  Result Date: 05/10/2022 CLINICAL DATA:  L scapula pain EXAM: CHEST - 2 VIEW COMPARISON:  Chest x-ray 04/01/2021 FINDINGS: The heart and mediastinal contours are unchanged. Aortic calcification. Hyperinflation of lungs. Bibasilar atelectasis versus scarring. No focal consolidation. Chronic coarsened interstitial markings with no overt pulmonary edema. Chronic blunting of the right costophrenic angle. No pleural effusion. No pneumothorax. No acute osseous abnormality. Chronic vertebral body height loss of the midthoracic vertebral bodies. IMPRESSION: 1. No active cardiopulmonary disease. 2. Aortic Atherosclerosis (ICD10-I70.0) and Emphysema (ICD10-J43.9). Electronically Signed   By: Iven Finn M.D.   On: 05/10/2022 17:18    Procedures Procedures    Medications Ordered in ED Medications  lidocaine (LIDODERM) 5 % 1 patch (1 patch Transdermal Patch Applied 05/10/22 1713)  morphine (PF) 4 MG/ML injection 4 mg (4 mg Intravenous Given 05/10/22 1713)    ED Course/ Medical Decision Making/ A&P                            Medical Decision Making 1 female with history of coronary artery disease, hypertension, COPD, type 2 diabetes mellitus brought to emergency department via ambulance for left upper back pain. Patient reports the pain in the upper back started last night.  Pain is constant, nonradiating.  Denies chest pain, shortness of breath, palpitations, dizziness, or vomiting.  Patient reports the pain worse with movement of the upper body and bending.  She denies injury or trauma.  She denies any strenuous activity which would have resulted into the upper back pain. Her blood pressure is elevated upon arrival.  She is denying chest pain or shortness of breath. On exam there is a tenderness to palpation to the  left upper back stable.  At the scapular area.  There is no visible redness, swelling, bruising or rash.  S1-S2 normal without gallop or murmur.  Lungs clear to auscultation without wheezes, Rales or rhonchi.  Abdomen soft, nontender.  No pedal edema.  Differential diagnosis includes ACS, musculoskeletal pain, muscle spasm.  She has a low threshold for PE or aortic dissection. Laceration includes CBC, BMP, EKG, chest x-ray, troponins. I will give her morphine 4 mg IV x 1 and lidocaine patch for pain relief.  I will continue to monitor the patient.  EKG: Sinus rhythm.  No acute ST or T changes appreciated. Chest x-ray: No acute coronary pulmonary disease noted. CBC, CMP unremarkable.  First troponin levels within normal limits.  I have reevaluated the patient.  She is still complaining of left upper back pain however patient reports the pain is better than before.  Pain only comes up with movement.  She is denying chest pain or shortness of breath.  Patient is comfortably laying on the bed.  Husband is in the room with the patient.  We are waiting for the second troponin levels.  If the readings look normal the plan is to discharge patient home. Patient wanted to drink something.  We  will give her p.o. challenge and crackers.  Second troponin results within normal limits.  I have discussed the results with the patient.  Patient in no acute distress.  Her pain in the left upper back has dramatically improved.  She rated pain 2 out of 10.  She is comfortably sitting on bed and drinking orange juice and eating crackers.  Patient in no distress and overall condition improved here in the ED. Detailed discussions were had with the patient regarding current findings, and need for close f/u with PCP or on call doctor. The patient has been instructed to return immediately if the symptoms worsen in any way for re-evaluation. Patient verbalized understanding and is in agreement with current care plan. All questions answered prior to discharge.  Patient in no distress and overall condition improved here in the ED. Detailed discussions were had with the patient regarding current findings, and need for close f/u with PCP or on call doctor. The patient has been instructed to return immediately if the symptoms worsen in any way for re-evaluation. Patient verbalized understanding and is in agreement with current care plan. All questions answered prior to discharge.   Amount and/or Complexity of Data Reviewed Labs: ordered. Radiology: ordered. ECG/medicine tests: ordered.  Risk Prescription drug management.          Final Clinical Impression(s) / ED Diagnoses Final diagnoses:  Musculoskeletal back pain    Rx / DC Orders ED Discharge Orders          Ordered    lidocaine (LIDODERM) 5 %  Every 24 hours        05/10/22 2024              Teola Bradley, MD 05/10/22 2038    Drenda Freeze, MD 05/10/22 2206

## 2022-05-10 NOTE — ED Triage Notes (Signed)
Pt arrives via [facility] from home c/o L scapula pain that started last night. Pt denies any recent falls/trauma. Pt states pain exacerbated this morning when she woke up.

## 2022-05-10 NOTE — Discharge Instructions (Signed)
Your pain consistent with musculoskeletal pain. I will send a prescription for lidocaine patch.  You may take Tylenol, ibuprofen as needed for pain. If pain continues to persist follow-up with the primary care physician for further recommendations.

## 2022-05-23 DIAGNOSIS — Z0000 Encounter for general adult medical examination without abnormal findings: Secondary | ICD-10-CM | POA: Diagnosis not present

## 2022-05-23 DIAGNOSIS — E1129 Type 2 diabetes mellitus with other diabetic kidney complication: Secondary | ICD-10-CM | POA: Diagnosis not present

## 2022-05-23 DIAGNOSIS — F331 Major depressive disorder, recurrent, moderate: Secondary | ICD-10-CM | POA: Diagnosis not present

## 2022-05-23 DIAGNOSIS — Z23 Encounter for immunization: Secondary | ICD-10-CM | POA: Diagnosis not present

## 2022-05-23 DIAGNOSIS — I739 Peripheral vascular disease, unspecified: Secondary | ICD-10-CM | POA: Diagnosis not present

## 2022-05-23 DIAGNOSIS — F17210 Nicotine dependence, cigarettes, uncomplicated: Secondary | ICD-10-CM | POA: Diagnosis not present

## 2022-05-23 DIAGNOSIS — N1831 Chronic kidney disease, stage 3a: Secondary | ICD-10-CM | POA: Diagnosis not present

## 2022-05-23 DIAGNOSIS — J449 Chronic obstructive pulmonary disease, unspecified: Secondary | ICD-10-CM | POA: Diagnosis not present

## 2022-05-23 DIAGNOSIS — N1832 Chronic kidney disease, stage 3b: Secondary | ICD-10-CM | POA: Diagnosis not present

## 2022-05-23 DIAGNOSIS — I10 Essential (primary) hypertension: Secondary | ICD-10-CM | POA: Diagnosis not present

## 2022-05-23 DIAGNOSIS — E1151 Type 2 diabetes mellitus with diabetic peripheral angiopathy without gangrene: Secondary | ICD-10-CM | POA: Diagnosis not present

## 2022-05-23 DIAGNOSIS — N3281 Overactive bladder: Secondary | ICD-10-CM | POA: Diagnosis not present

## 2022-05-23 DIAGNOSIS — E7800 Pure hypercholesterolemia, unspecified: Secondary | ICD-10-CM | POA: Diagnosis not present

## 2022-06-02 ENCOUNTER — Emergency Department (HOSPITAL_COMMUNITY): Payer: Medicare HMO

## 2022-06-02 ENCOUNTER — Other Ambulatory Visit: Payer: Self-pay

## 2022-06-02 ENCOUNTER — Emergency Department (HOSPITAL_COMMUNITY)
Admission: EM | Admit: 2022-06-02 | Discharge: 2022-06-03 | Disposition: A | Discharge status: Home / Self Care | Payer: Medicare HMO | Attending: Emergency Medicine | Admitting: Emergency Medicine

## 2022-06-02 ENCOUNTER — Encounter (HOSPITAL_COMMUNITY): Payer: Self-pay | Admitting: *Deleted

## 2022-06-02 DIAGNOSIS — I10 Essential (primary) hypertension: Secondary | ICD-10-CM | POA: Diagnosis not present

## 2022-06-02 DIAGNOSIS — J90 Pleural effusion, not elsewhere classified: Secondary | ICD-10-CM | POA: Diagnosis not present

## 2022-06-02 DIAGNOSIS — I3139 Other pericardial effusion (noninflammatory): Secondary | ICD-10-CM | POA: Diagnosis not present

## 2022-06-02 DIAGNOSIS — J9811 Atelectasis: Secondary | ICD-10-CM | POA: Diagnosis not present

## 2022-06-02 DIAGNOSIS — R0789 Other chest pain: Secondary | ICD-10-CM | POA: Diagnosis not present

## 2022-06-02 DIAGNOSIS — R0602 Shortness of breath: Secondary | ICD-10-CM | POA: Diagnosis not present

## 2022-06-02 DIAGNOSIS — Z1152 Encounter for screening for COVID-19: Secondary | ICD-10-CM | POA: Diagnosis not present

## 2022-06-02 DIAGNOSIS — R072 Precordial pain: Secondary | ICD-10-CM

## 2022-06-02 DIAGNOSIS — Z7982 Long term (current) use of aspirin: Secondary | ICD-10-CM | POA: Insufficient documentation

## 2022-06-02 DIAGNOSIS — J189 Pneumonia, unspecified organism: Secondary | ICD-10-CM | POA: Insufficient documentation

## 2022-06-02 DIAGNOSIS — E119 Type 2 diabetes mellitus without complications: Secondary | ICD-10-CM | POA: Insufficient documentation

## 2022-06-02 DIAGNOSIS — Z7984 Long term (current) use of oral hypoglycemic drugs: Secondary | ICD-10-CM | POA: Diagnosis not present

## 2022-06-02 DIAGNOSIS — Z79899 Other long term (current) drug therapy: Secondary | ICD-10-CM | POA: Insufficient documentation

## 2022-06-02 DIAGNOSIS — F17210 Nicotine dependence, cigarettes, uncomplicated: Secondary | ICD-10-CM | POA: Diagnosis not present

## 2022-06-02 DIAGNOSIS — N281 Cyst of kidney, acquired: Secondary | ICD-10-CM | POA: Diagnosis not present

## 2022-06-02 DIAGNOSIS — R079 Chest pain, unspecified: Secondary | ICD-10-CM | POA: Diagnosis not present

## 2022-06-02 DIAGNOSIS — J449 Chronic obstructive pulmonary disease, unspecified: Secondary | ICD-10-CM | POA: Diagnosis not present

## 2022-06-02 DIAGNOSIS — I2510 Atherosclerotic heart disease of native coronary artery without angina pectoris: Secondary | ICD-10-CM | POA: Diagnosis not present

## 2022-06-02 LAB — BASIC METABOLIC PANEL
Anion gap: 15 (ref 5–15)
BUN: 15 mg/dL (ref 8–23)
CO2: 22 mmol/L (ref 22–32)
Calcium: 9.3 mg/dL (ref 8.9–10.3)
Chloride: 101 mmol/L (ref 98–111)
Creatinine, Ser: 1 mg/dL (ref 0.44–1.00)
GFR, Estimated: 56 mL/min — ABNORMAL LOW (ref >60)
Glucose, Bld: 119 mg/dL — ABNORMAL HIGH (ref 70–99)
Potassium: 3.9 mmol/L (ref 3.5–5.1)
Sodium: 138 mmol/L (ref 135–145)

## 2022-06-02 LAB — CBC
HCT: 41.5 % (ref 36.0–46.0)
Hemoglobin: 14 g/dL (ref 12.0–15.0)
MCH: 28.4 pg (ref 26.0–34.0)
MCHC: 33.7 g/dL (ref 30.0–36.0)
MCV: 84.2 fL (ref 80.0–100.0)
Platelets: 296 10*3/uL (ref 150–400)
RBC: 4.93 MIL/uL (ref 3.87–5.11)
RDW: 13.2 % (ref 11.5–15.5)
WBC: 10.2 10*3/uL (ref 4.0–10.5)
nRBC: 0 % (ref 0.0–0.2)

## 2022-06-02 LAB — TROPONIN I (HIGH SENSITIVITY): Troponin I (High Sensitivity): 64 ng/L — ABNORMAL HIGH (ref <18)

## 2022-06-02 NOTE — ED Provider Triage Note (Signed)
Emergency Medicine Provider Triage Evaluation Note  Allison [name] , a 81 y.o. female  was evaluated in triage.  Pt complains of intermittent chest pain and SOB x 5 days. Was sitting at the dining room table when the pain started. Hx of COPD, CAD. Believes she has a sinus infection because her tongue has looked white.   Review of Systems  Positive: CP, SOB, productive cough Negative: Nausea, vomiting  Physical Exam  BP (!) 147/71 (BP Location: Left Arm)   Pulse (!) 102   Temp 98.4 F (36.9 C) (Oral)   Resp 15   SpO2 97%  Gen:   Awake, no distress   Resp:  Normal effort  MSK:   Moves extremities without difficulty  Other:    Medical Decision Making  Medically screening exam initiated at 8:20 PM.  Appropriate orders placed.  Allison [name] was informed that the remainder of the evaluation will be completed by another provider, this initial triage assessment does not replace that evaluation, and the importance of remaining in the ED until their evaluation is complete.  Workup initiated   Allison Beltran T, PA-C 06/02/22 2022

## 2022-06-02 NOTE — ED Triage Notes (Signed)
Pt reporting for about 4-5 days she has had chest pain and worsening shortness of breath. Reporting central chest discomfort.

## 2022-06-03 ENCOUNTER — Emergency Department (HOSPITAL_COMMUNITY): Payer: Medicare HMO

## 2022-06-03 DIAGNOSIS — I3139 Other pericardial effusion (noninflammatory): Secondary | ICD-10-CM | POA: Diagnosis not present

## 2022-06-03 DIAGNOSIS — N281 Cyst of kidney, acquired: Secondary | ICD-10-CM | POA: Diagnosis not present

## 2022-06-03 DIAGNOSIS — R0602 Shortness of breath: Secondary | ICD-10-CM | POA: Diagnosis not present

## 2022-06-03 DIAGNOSIS — R079 Chest pain, unspecified: Secondary | ICD-10-CM | POA: Diagnosis not present

## 2022-06-03 LAB — TROPONIN I (HIGH SENSITIVITY)
Troponin I (High Sensitivity): 75 ng/L — ABNORMAL HIGH (ref <18)
Troponin I (High Sensitivity): 57 ng/L — ABNORMAL HIGH (ref <18)

## 2022-06-03 LAB — RESP PANEL BY RT-PCR (FLU A&B, COVID) ARPGX2
Influenza A by PCR: NEGATIVE
Influenza B by PCR: NEGATIVE
SARS Coronavirus 2 by RT PCR: NEGATIVE

## 2022-06-03 MED ORDER — IOHEXOL 350 MG/ML SOLN
75.0000 mL | Freq: Once | INTRAVENOUS | Status: AC | PRN
  Administered 2022-06-03: 75 mL via INTRAVENOUS

## 2022-06-03 MED ORDER — LEVOFLOXACIN IN D5W 750 MG/150ML IV SOLN
750.0000 mg | Freq: Once | INTRAVENOUS | Status: AC
  Administered 2022-06-03: 750 mg via INTRAVENOUS
  Filled 2022-06-03: qty 150

## 2022-06-03 MED ORDER — LEVOFLOXACIN 750 MG PO TABS: 750.0000 mg | ORAL_TABLET | Freq: Every day | ORAL | 0 refills | Status: DC

## 2022-06-03 NOTE — ED Provider Notes (Addendum)
Triaged by previous provider, presents with shortness of breath and pleuritic chest pain, ongoing for last 5 days, endorses sputum production, history of COPD, no cardiac history, no history PEs or DVTs, not on hormone therapy, patient has an elevated troponin, she remains tachycardic tachypneic, reviewed her chest x-ray shows new pleural effusion, lung sounds had notable rales on the right side, intermittent wheezing, speaking full sentences, slightly tachypneic, I am concerned for possible PE versus infectious etiology will send down for CTA for further evaluation.  Addendum-CTA is negative for PE, consistent with pneumonia, charge nurse was made aware this, recommend rooming as soon as possible.  Ordered antibiotics, oncoming PA Smoot was made aware of the situation and will continue to monitor   Marcello Fennel, PA-C 06/03/22 0243    Marcello Fennel, PA-C 06/03/22 [phone]    Merryl Hacker, MD 06/03/22 [phone]    Marcello Fennel, PA-C 06/03/22 8403    Fredia Sorrow, MD 06/03/22 [phone]

## 2022-06-03 NOTE — ED Provider Notes (Addendum)
[facility] [address] [address] EMERGENCY DEPARTMENT Provider Note   CSN: [id number] Arrival date & time: 06/02/22  1953     History  Chief Complaint  Patient presents with   Chest Pain    BERENICE OEHLERT is a 81 y.o. female.  Patient came in for chest pain substernal worse with taking a deep breath.  Patient has had a lot of coughing and mucus production for the past week.  Last Friday patient got her flu shot and she got sick shortly after that.  The substernal chest pain seems to have a pleuritic component to it.  Past medical history significant for peripheral vascular disease hypertension hyperlipidemia diabetes coronary artery disease and COPD.  Past surgical history significant for appendectomy and abdominal hysterectomy.  Patient still smokes cigarettes at times.       Home Medications Prior to Admission medications   Medication Sig Start Date End Date Taking? Authorizing Provider  Amlodipine-Olmesartan (AZOR PO) Take by mouth.    [provider]  aspirin 81 MG tablet Take 81 mg by mouth daily.    [provider]  atorvastatin (LIPITOR) 40 MG tablet daily. 04/23/12   [provider]  hydrochlorothiazide (HYDRODIURIL) 25 MG tablet Take 25 mg by mouth daily.    [provider]  JANUMET 50-500 MG tablet TK 1 T PO QAM WITH A MEAL 07/19/15   [provider]  lidocaine (LIDODERM) 5 % Place 1 patch onto the skin daily. Remove & Discard patch within 12 hours or as directed by MD 05/10/22   Drenda Freeze, MD  lisinopril (PRINIVIL,ZESTRIL) 40 MG tablet Take 40 mg by mouth daily.    [provider]  Multiple Vitamin (MULTIVITAMIN) tablet Take 1 tablet by mouth daily.    [provider]  naproxen sodium (ANAPROX) 220 MG tablet Take 220 mg by mouth 2 (two) times daily with a meal.    [provider]  rosuvastatin (CRESTOR) 20 MG tablet Take 20 mg by mouth daily.    [provider]      Allergies     Cefaclor, Minocycline hcl, Nitrofurantoin, and Sucralfate    Review of Systems   Review of Systems  Constitutional:  Negative for chills and fever.  HENT:  Negative for ear pain and sore throat.   Eyes:  Negative for pain and visual disturbance.  Respiratory:  Negative for cough, shortness of breath and wheezing.   Cardiovascular:  Negative for chest pain and palpitations.  Gastrointestinal:  Negative for abdominal pain and vomiting.  Genitourinary:  Negative for dysuria and hematuria.  Musculoskeletal:  Negative for arthralgias and back pain.  Skin:  Negative for color change and rash.  Neurological:  Negative for seizures and syncope.  All other systems reviewed and are negative.   Physical Exam Updated Vital Signs BP 125/75 (BP Location: Left Arm)   Pulse 85   Temp 98.2 F (36.8 C) (Oral)   Resp 16   SpO2 95%  Physical Exam Vitals and nursing note reviewed.  Constitutional:      General: She is not in acute distress.    Appearance: She is well-developed.  HENT:     Head: Normocephalic and atraumatic.  Eyes:     Conjunctiva/sclera: Conjunctivae normal.  Cardiovascular:     Rate and Rhythm: Normal rate and regular rhythm.     Heart sounds: No murmur heard. Pulmonary:     Effort: Pulmonary effort is normal. No respiratory distress.     Breath sounds: Examination  of the right-lower field reveals decreased breath sounds. Decreased breath sounds and rhonchi present. No wheezing or rales.  Abdominal:     Palpations: Abdomen is soft.     Tenderness: There is no abdominal tenderness.  Musculoskeletal:        General: No swelling.     Cervical back: Neck supple.     Right lower leg: No edema.     Left lower leg: No edema.  Skin:    General: Skin is warm and dry.     Capillary Refill: Capillary refill takes less than 2 seconds.  Neurological:     General: No focal deficit present.     Mental Status: She is alert and oriented to person, place, and time.  Psychiatric:         Mood and Affect: Mood normal.     ED Results / Procedures / Treatments   Labs (all labs ordered are listed, but only abnormal results are displayed) Labs Reviewed  BASIC METABOLIC PANEL - Abnormal; Notable for the following components:      Result Value   Glucose, Bld 119 (*)    GFR, Estimated 56 (*)    All other components within normal limits  TROPONIN I (HIGH SENSITIVITY) - Abnormal; Notable for the following components:   Troponin I (High Sensitivity) 64 (*)    All other components within normal limits  TROPONIN I (HIGH SENSITIVITY) - Abnormal; Notable for the following components:   Troponin I (High Sensitivity) 75 (*)    All other components within normal limits  RESP PANEL BY RT-PCR (FLU A&B, COVID) ARPGX2  CBC  TROPONIN I (HIGH SENSITIVITY)    EKG EKG Interpretation  Date/Time:  Monday June 02 2022 20:58:33 EDT Ventricular Rate:  100 PR Interval:  120 QRS Duration: 58 QT Interval:  304 QTC Calculation: 392 R Axis:   11 Text Interpretation: Normal sinus rhythm Normal ECG When compared with ECG of 10-May-2022 16:56, PREVIOUS ECG IS PRESENT Confirmed by Fredia Sorrow [phone]) on 06/03/2022 9:37:36 AM  Radiology CT Angio Chest PE W and/or Wo Contrast  Result Date: 06/03/2022 CLINICAL DATA:  Pulmonary embolus suspected with high probability. Shortness of breath and chest pain for 5 days. EXAM: CT ANGIOGRAPHY CHEST WITH CONTRAST TECHNIQUE: Multidetector CT imaging of the chest was performed using the standard protocol during bolus administration of intravenous contrast. Multiplanar CT image reconstructions and MIPs were obtained to evaluate the vascular anatomy. RADIATION DOSE REDUCTION: This exam was performed according to the departmental dose-optimization program which includes automated exposure control, adjustment of the mA and/or kV according to patient size and/or use of iterative reconstruction technique. CONTRAST:  34mL OMNIPAQUE IOHEXOL 350 MG/ML SOLN  COMPARISON:  None Available. FINDINGS: Cardiovascular: Good opacification of the central and segmental pulmonary arteries. No focal filling defects. No evidence of significant pulmonary embolus. Normal heart size. Small pericardial effusion. Normal caliber thoracic aorta. Calcification of the aorta and coronary arteries. Mediastinum/Nodes: Thyroid gland is unremarkable. Esophagus is decompressed. No significant lymphadenopathy. Lungs/Pleura: Large right pleural effusion with possible loculations. Atelectasis or consolidation in the right lower lung with patchy infiltrates throughout the remainder of the right lung, likely pneumonia. Left lung is clear. Large pneumatocele in the left lingula. Upper Abdomen: No acute abnormalities demonstrated. Renal cysts measuring up to 5.3 cm diameter. No imaging follow-up is indicated. Musculoskeletal: Degenerative changes.  No destructive bone lesions. Review of the MIP images confirms the above findings. IMPRESSION: 1. No evidence of significant pulmonary embolus. 2. Large right pleural effusion,  possibly loculated, with infiltration and consolidation in the right lung likely indicating pneumonia. Electronically Signed   By: Lucienne Capers M.D.   On: 06/03/2022 03:48   DG Chest 2 View  Result Date: 06/02/2022 CLINICAL DATA:  Shortness of breath EXAM: CHEST - 2 VIEW COMPARISON:  [id number] FINDINGS: Moderate right pleural effusion. Associated right lower lobe compressive atelectasis. Left lung is clear. No pneumothorax. The heart is normal in size.  Thoracic aortic atherosclerosis. IMPRESSION: Moderate right pleural effusion with right lower lobe atelectasis. Electronically Signed   By: Julian Hy M.D.   On: 06/02/2022 20:55    Procedures Procedures    Medications Ordered in ED Medications  iohexol (OMNIPAQUE) 350 MG/ML injection 75 mL (75 mLs Intravenous Contrast Given 06/03/22 0339)  levofloxacin (LEVAQUIN) IVPB 750 mg (750 mg Intravenous New Bag/Given 06/03/22  0715)    ED Course/ Medical Decision Making/ A&P                           Medical Decision Making Risk Prescription drug management.   Patient's triage work-up patient's had 2 troponins done.  First 1 was 64 delta troponin was 75 so a little bit more than 10 increase and that the last 1 was done at 2:30 in the morning.  Patient basic metabolic panel good CBC no leukocytosis hemoglobin 14.  Patient's renal function GFR 56.  Patient had CT angio done which was appropriate based on her complaints.  That shows no pulmonary embolus but a large right pleural effusion possibly loculated with infiltration and consolidation in the right lung likely indicating pneumonia.  The regular chest x-ray showed that right pleural effusion with right lower lobe Actal atelectasis.  EKG had a lot of baseline wavering and some artifact but no evidence of acute cardiac event.  Patient clinically fairly stable.  We will do third troponin just to make sure we do not have increasing troponins.  Patient wants to be checked for COVID so we will check that but we do not have to wait for that to come back.  We will get patient on cardiac monitor make sure her oxygen sats are good.  Patient with a fairly significant right-sided pleural effusion and pneumonia.  Patient is already received 1 dose of Levaquin that was given during triage overnight.  If patient has any oxygen problems or if there is further increase in troponins and patient will require admission.  Patient is saturations have been steady in the low 90s.  Patient's third troponin 57 so definitely down so no concerns for an acute cardiac event.  It peaked at 1.  COVID testing flu testing negative as well.  We will have patient continue with Levaquin at home.  Follow-up with her primary care doctor.  Final Clinical Impression(s) / ED Diagnoses Final diagnoses:  Precordial pain  Community acquired pneumonia of right lower lobe of lung    Rx / DC  Orders ED Discharge Orders     None         Fredia Sorrow, MD 06/03/22 7989    Fredia Sorrow, MD 06/03/22 1232

## 2022-06-03 NOTE — Discharge Instructions (Addendum)
Cardiac work-up without any acute findings.  Third troponin was much lower than the others.  Take the antibiotic Levaquin as directed for the next 7 days make an appointment to follow-up with your doctor.  Also COVID influenza testing was negative.  So it appears that your symptoms are all related to the right lung pneumonia.  Return for any new or worse symptoms.

## 2022-06-09 ENCOUNTER — Other Ambulatory Visit: Payer: Self-pay | Admitting: *Deleted

## 2022-06-09 DIAGNOSIS — I739 Peripheral vascular disease, unspecified: Secondary | ICD-10-CM

## 2022-06-10 ENCOUNTER — Ambulatory Visit
Admission: RE | Admit: 2022-06-10 | Discharge: 2022-06-10 | Disposition: A | Discharge status: Home / Self Care | Payer: Medicare HMO | Source: Ambulatory Visit | Attending: Internal Medicine | Admitting: Internal Medicine

## 2022-06-10 ENCOUNTER — Other Ambulatory Visit: Payer: Self-pay | Admitting: Internal Medicine

## 2022-06-10 DIAGNOSIS — J90 Pleural effusion, not elsewhere classified: Secondary | ICD-10-CM | POA: Diagnosis not present

## 2022-06-10 DIAGNOSIS — J189 Pneumonia, unspecified organism: Secondary | ICD-10-CM

## 2022-06-11 ENCOUNTER — Other Ambulatory Visit: Payer: Self-pay

## 2022-06-11 ENCOUNTER — Inpatient Hospital Stay (HOSPITAL_COMMUNITY): Payer: Medicare HMO

## 2022-06-11 ENCOUNTER — Emergency Department (HOSPITAL_COMMUNITY): Payer: Medicare HMO

## 2022-06-11 ENCOUNTER — Inpatient Hospital Stay (HOSPITAL_COMMUNITY)
Admission: EM | Admit: 2022-06-11 | Discharge: 2022-06-16 | DRG: 186 | Disposition: A | Discharge status: Home / Self Care | Payer: Medicare HMO | Attending: Internal Medicine | Admitting: Internal Medicine

## 2022-06-11 ENCOUNTER — Encounter (HOSPITAL_COMMUNITY): Payer: Self-pay

## 2022-06-11 DIAGNOSIS — E7800 Pure hypercholesterolemia, unspecified: Secondary | ICD-10-CM | POA: Diagnosis present

## 2022-06-11 DIAGNOSIS — J90 Pleural effusion, not elsewhere classified: Secondary | ICD-10-CM | POA: Diagnosis not present

## 2022-06-11 DIAGNOSIS — Z881 Allergy status to other antibiotic agents status: Secondary | ICD-10-CM

## 2022-06-11 DIAGNOSIS — Z8639 Personal history of other endocrine, nutritional and metabolic disease: Secondary | ICD-10-CM

## 2022-06-11 DIAGNOSIS — I472 Ventricular tachycardia, unspecified: Secondary | ICD-10-CM | POA: Diagnosis not present

## 2022-06-11 DIAGNOSIS — E1151 Type 2 diabetes mellitus with diabetic peripheral angiopathy without gangrene: Secondary | ICD-10-CM | POA: Diagnosis present

## 2022-06-11 DIAGNOSIS — R079 Chest pain, unspecified: Secondary | ICD-10-CM | POA: Diagnosis not present

## 2022-06-11 DIAGNOSIS — R846 Abnormal cytological findings in specimens from respiratory organs and thorax: Secondary | ICD-10-CM | POA: Diagnosis not present

## 2022-06-11 DIAGNOSIS — J984 Other disorders of lung: Secondary | ICD-10-CM | POA: Diagnosis not present

## 2022-06-11 DIAGNOSIS — Z79899 Other long term (current) drug therapy: Secondary | ICD-10-CM

## 2022-06-11 DIAGNOSIS — Z7951 Long term (current) use of inhaled steroids: Secondary | ICD-10-CM | POA: Diagnosis not present

## 2022-06-11 DIAGNOSIS — I2510 Atherosclerotic heart disease of native coronary artery without angina pectoris: Secondary | ICD-10-CM | POA: Diagnosis not present

## 2022-06-11 DIAGNOSIS — N289 Disorder of kidney and ureter, unspecified: Secondary | ICD-10-CM | POA: Diagnosis present

## 2022-06-11 DIAGNOSIS — E1165 Type 2 diabetes mellitus with hyperglycemia: Secondary | ICD-10-CM | POA: Diagnosis present

## 2022-06-11 DIAGNOSIS — J9811 Atelectasis: Secondary | ICD-10-CM | POA: Diagnosis present

## 2022-06-11 DIAGNOSIS — I739 Peripheral vascular disease, unspecified: Secondary | ICD-10-CM | POA: Diagnosis present

## 2022-06-11 DIAGNOSIS — Z720 Tobacco use: Secondary | ICD-10-CM | POA: Diagnosis present

## 2022-06-11 DIAGNOSIS — R0602 Shortness of breath: Secondary | ICD-10-CM | POA: Diagnosis not present

## 2022-06-11 DIAGNOSIS — Z888 Allergy status to other drugs, medicaments and biological substances status: Secondary | ICD-10-CM

## 2022-06-11 DIAGNOSIS — J440 Chronic obstructive pulmonary disease with acute lower respiratory infection: Secondary | ICD-10-CM | POA: Diagnosis present

## 2022-06-11 DIAGNOSIS — R7989 Other specified abnormal findings of blood chemistry: Secondary | ICD-10-CM | POA: Diagnosis not present

## 2022-06-11 DIAGNOSIS — J449 Chronic obstructive pulmonary disease, unspecified: Secondary | ICD-10-CM | POA: Diagnosis present

## 2022-06-11 DIAGNOSIS — J189 Pneumonia, unspecified organism: Secondary | ICD-10-CM | POA: Diagnosis present

## 2022-06-11 DIAGNOSIS — Z1152 Encounter for screening for COVID-19: Secondary | ICD-10-CM | POA: Diagnosis not present

## 2022-06-11 DIAGNOSIS — R918 Other nonspecific abnormal finding of lung field: Secondary | ICD-10-CM | POA: Diagnosis not present

## 2022-06-11 DIAGNOSIS — R0603 Acute respiratory distress: Secondary | ICD-10-CM | POA: Diagnosis not present

## 2022-06-11 DIAGNOSIS — Z4682 Encounter for fitting and adjustment of non-vascular catheter: Secondary | ICD-10-CM | POA: Diagnosis not present

## 2022-06-11 DIAGNOSIS — I10 Essential (primary) hypertension: Secondary | ICD-10-CM | POA: Diagnosis present

## 2022-06-11 DIAGNOSIS — Z9689 Presence of other specified functional implants: Secondary | ICD-10-CM | POA: Diagnosis not present

## 2022-06-11 DIAGNOSIS — J918 Pleural effusion in other conditions classified elsewhere: Secondary | ICD-10-CM | POA: Diagnosis not present

## 2022-06-11 DIAGNOSIS — I4729 Other ventricular tachycardia: Secondary | ICD-10-CM | POA: Diagnosis not present

## 2022-06-11 DIAGNOSIS — F17210 Nicotine dependence, cigarettes, uncomplicated: Secondary | ICD-10-CM | POA: Diagnosis present

## 2022-06-11 DIAGNOSIS — Z6824 Body mass index (BMI) 24.0-24.9, adult: Secondary | ICD-10-CM | POA: Diagnosis not present

## 2022-06-11 DIAGNOSIS — E440 Moderate protein-calorie malnutrition: Secondary | ICD-10-CM | POA: Diagnosis present

## 2022-06-11 DIAGNOSIS — R6510 Systemic inflammatory response syndrome (SIRS) of non-infectious origin without acute organ dysfunction: Secondary | ICD-10-CM | POA: Diagnosis not present

## 2022-06-11 DIAGNOSIS — R091 Pleurisy: Secondary | ICD-10-CM | POA: Diagnosis not present

## 2022-06-11 DIAGNOSIS — J432 Centrilobular emphysema: Secondary | ICD-10-CM | POA: Diagnosis not present

## 2022-06-11 DIAGNOSIS — I700 Atherosclerosis of aorta: Secondary | ICD-10-CM | POA: Diagnosis not present

## 2022-06-11 LAB — CBC WITH DIFFERENTIAL/PLATELET
Abs Immature Granulocytes: 0.05 10*3/uL (ref 0.00–0.07)
Basophils Absolute: 0.1 10*3/uL (ref 0.0–0.1)
Basophils Relative: 0 %
Eosinophils Absolute: 0.1 10*3/uL (ref 0.0–0.5)
Eosinophils Relative: 1 %
HCT: 43.1 % (ref 36.0–46.0)
Hemoglobin: 14.9 g/dL (ref 12.0–15.0)
Immature Granulocytes: 0 %
Lymphocytes Relative: 10 %
Lymphs Abs: 1.2 10*3/uL (ref 0.7–4.0)
MCH: 28.9 pg (ref 26.0–34.0)
MCHC: 34.6 g/dL (ref 30.0–36.0)
MCV: 83.5 fL (ref 80.0–100.0)
Monocytes Absolute: 1.2 10*3/uL — ABNORMAL HIGH (ref 0.1–1.0)
Monocytes Relative: 10 %
Neutro Abs: 9.5 10*3/uL — ABNORMAL HIGH (ref 1.7–7.7)
Neutrophils Relative %: 79 %
Platelets: 305 10*3/uL (ref 150–400)
RBC: 5.16 MIL/uL — ABNORMAL HIGH (ref 3.87–5.11)
RDW: 13 % (ref 11.5–15.5)
WBC: 12 10*3/uL — ABNORMAL HIGH (ref 4.0–10.5)
nRBC: 0 % (ref 0.0–0.2)

## 2022-06-11 LAB — COMPREHENSIVE METABOLIC PANEL
ALT: 22 U/L (ref 0–44)
AST: 29 U/L (ref 15–41)
Albumin: 2.7 g/dL — ABNORMAL LOW (ref 3.5–5.0)
Alkaline Phosphatase: 69 U/L (ref 38–126)
Anion gap: 11 (ref 5–15)
BUN: 19 mg/dL (ref 8–23)
CO2: 26 mmol/L (ref 22–32)
Calcium: 9.4 mg/dL (ref 8.9–10.3)
Chloride: 100 mmol/L (ref 98–111)
Creatinine, Ser: 1.15 mg/dL — ABNORMAL HIGH (ref 0.44–1.00)
GFR, Estimated: 47 mL/min — ABNORMAL LOW (ref >60)
Glucose, Bld: 128 mg/dL — ABNORMAL HIGH (ref 70–99)
Potassium: 4.1 mmol/L (ref 3.5–5.1)
Sodium: 137 mmol/L (ref 135–145)
Total Bilirubin: 0.2 mg/dL — ABNORMAL LOW (ref 0.3–1.2)
Total Protein: 6.7 g/dL (ref 6.5–8.1)

## 2022-06-11 LAB — BODY FLUID CELL COUNT WITH DIFFERENTIAL
Eos, Fluid: 0 %
Lymphs, Fluid: 73 %
Monocyte-Macrophage-Serous Fluid: 14 % — ABNORMAL LOW (ref 50–90)
Neutrophil Count, Fluid: 13 % (ref 0–25)
Total Nucleated Cell Count, Fluid: 1088 cu mm — ABNORMAL HIGH (ref 0–1000)

## 2022-06-11 LAB — PROTEIN, BODY FLUID (OTHER): Total Protein, Body Fluid Other: 4.4

## 2022-06-11 LAB — LACTATE DEHYDROGENASE, PLEURAL OR PERITONEAL FLUID: LD, Fluid: 752 U/L — ABNORMAL HIGH (ref 3–23)

## 2022-06-11 LAB — TROPONIN I (HIGH SENSITIVITY)
Troponin I (High Sensitivity): 26 ng/L — ABNORMAL HIGH (ref <18)
Troponin I (High Sensitivity): 20 ng/L — ABNORMAL HIGH (ref <18)

## 2022-06-11 LAB — GLUCOSE, PLEURAL OR PERITONEAL FLUID: Glucose, Fluid: 123 mg/dL

## 2022-06-11 LAB — PROTIME-INR
INR: 1.1 (ref 0.8–1.2)
Prothrombin Time: 14 seconds (ref 11.4–15.2)

## 2022-06-11 LAB — RESP PANEL BY RT-PCR (FLU A&B, COVID) ARPGX2
Influenza A by PCR: NEGATIVE
Influenza B by PCR: NEGATIVE
SARS Coronavirus 2 by RT PCR: NEGATIVE

## 2022-06-11 LAB — LACTIC ACID, PLASMA: Lactic Acid, Venous: 1.4 mmol/L (ref 0.5–1.9)

## 2022-06-11 LAB — BRAIN NATRIURETIC PEPTIDE: B Natriuretic Peptide: 76.8 pg/mL (ref 0.0–100.0)

## 2022-06-11 LAB — APTT: aPTT: 32 seconds (ref 24–36)

## 2022-06-11 MED ORDER — FLUTICASONE FUROATE-VILANTEROL 100-25 MCG/ACT IN AEPB
1.0000 | INHALATION_SPRAY | Freq: Every day | RESPIRATORY_TRACT | Status: DC
  Administered 2022-06-12 – 2022-06-16 (×5): 1 via RESPIRATORY_TRACT
  Filled 2022-06-11 (×2): qty 28

## 2022-06-11 MED ORDER — ACETAMINOPHEN 650 MG RE SUPP: 650.0000 mg | Freq: Four times a day (QID) | RECTAL | Status: DC | PRN

## 2022-06-11 MED ORDER — ENOXAPARIN SODIUM 40 MG/0.4ML IJ SOSY
40.0000 mg | PREFILLED_SYRINGE | INTRAMUSCULAR | Status: DC
  Administered 2022-06-11 – 2022-06-15 (×5): 40 mg via SUBCUTANEOUS
  Filled 2022-06-11 (×5): qty 0.4

## 2022-06-11 MED ORDER — SODIUM CHLORIDE 0.9% FLUSH
3.0000 mL | Freq: Two times a day (BID) | INTRAVENOUS | Status: DC
  Administered 2022-06-11 – 2022-06-15 (×9): 3 mL via INTRAVENOUS

## 2022-06-11 MED ORDER — NICOTINE 7 MG/24HR TD PT24
7.0000 mg | MEDICATED_PATCH | Freq: Every day | TRANSDERMAL | Status: DC
  Filled 2022-06-11 (×7): qty 1

## 2022-06-11 MED ORDER — ALBUTEROL SULFATE (2.5 MG/3ML) 0.083% IN NEBU: 2.5000 mg | INHALATION_SOLUTION | Freq: Four times a day (QID) | RESPIRATORY_TRACT | Status: DC | PRN

## 2022-06-11 MED ORDER — SODIUM CHLORIDE 0.9 % IV SOLN
2.0000 g | Freq: Two times a day (BID) | INTRAVENOUS | Status: AC
  Administered 2022-06-11 – 2022-06-15 (×10): 2 g via INTRAVENOUS
  Filled 2022-06-11 (×10): qty 12.5

## 2022-06-11 MED ORDER — VANCOMYCIN HCL 750 MG/150ML IV SOLN
750.0000 mg | INTRAVENOUS | Status: DC
  Filled 2022-06-11: qty 150

## 2022-06-11 MED ORDER — VANCOMYCIN HCL 1250 MG/250ML IV SOLN
1250.0000 mg | Freq: Once | INTRAVENOUS | Status: AC
  Administered 2022-06-11: 1250 mg via INTRAVENOUS
  Filled 2022-06-11: qty 250

## 2022-06-11 MED ORDER — HYDROMORPHONE HCL 1 MG/ML IJ SOLN
0.5000 mg | Freq: Once | INTRAMUSCULAR | Status: AC
  Administered 2022-06-11: 0.5 mg via INTRAVENOUS
  Filled 2022-06-11: qty 1

## 2022-06-11 MED ORDER — SODIUM CHLORIDE 0.9% FLUSH
10.0000 mL | Freq: Three times a day (TID) | INTRAVENOUS | Status: DC
  Administered 2022-06-11 – 2022-06-14 (×6): 10 mL via INTRAPLEURAL

## 2022-06-11 MED ORDER — ACETAMINOPHEN 325 MG PO TABS
650.0000 mg | ORAL_TABLET | Freq: Four times a day (QID) | ORAL | Status: DC | PRN
  Administered 2022-06-13 – 2022-06-14 (×2): 650 mg via ORAL
  Filled 2022-06-11 (×3): qty 2

## 2022-06-11 MED ORDER — OXYCODONE HCL 5 MG PO TABS: 2.5000 mg | ORAL_TABLET | ORAL | Status: DC | PRN

## 2022-06-11 MED ORDER — GUAIFENESIN ER 600 MG PO TB12
600.0000 mg | ORAL_TABLET | Freq: Two times a day (BID) | ORAL | Status: DC
  Administered 2022-06-11 – 2022-06-16 (×11): 600 mg via ORAL
  Filled 2022-06-11 (×10): qty 1

## 2022-06-11 MED ORDER — ROSUVASTATIN CALCIUM 20 MG PO TABS
40.0000 mg | ORAL_TABLET | Freq: Every day | ORAL | Status: DC
  Administered 2022-06-11 – 2022-06-16 (×6): 40 mg via ORAL
  Filled 2022-06-11 (×7): qty 2

## 2022-06-11 MED ORDER — UMECLIDINIUM BROMIDE 62.5 MCG/ACT IN AEPB
1.0000 | INHALATION_SPRAY | Freq: Every day | RESPIRATORY_TRACT | Status: DC
  Administered 2022-06-12 – 2022-06-16 (×5): 1 via RESPIRATORY_TRACT
  Filled 2022-06-11 (×2): qty 7

## 2022-06-11 NOTE — ED Provider Triage Note (Signed)
Emergency Medicine Provider Triage Evaluation Note  Allison [name] , a 81 y.o. female  was evaluated in triage.  Pt complains of worsening shortness of breath and nonproductive cough of the last 2 days.  Recent diagnosed with pneumonia around October 10.  Has been on antibiotics.  States "I have never felt this sick".  Denies chest pain, and/V/D, abdominal pain, or changes in urinary habits.  Review of Systems  Positive:  Negative: See above  Physical Exam  BP 128/66 (BP Location: Right Arm)   Pulse (!) 101   Temp 98.3 F (36.8 C) (Oral)   Resp (!) 30   Ht 5\' 3"  (1.6 m)   Wt 64 kg   SpO2 95%   BMI 24.98 kg/m  Gen:   Awake, no distress   Resp:  Mildly increased effort, tachypneic, Rales of the right lower lobe, equal chest rise MSK:   Moves extremities without difficulty  Other:  Appears clinically fatigued/weak.  Chest and abdomen non-TTP, abdomen soft.  Mildly tachycardic around 100 bpm.  Skin warm and dry.  Radial pulses 2+.  Medical Decision Making  Medically screening exam initiated at 1:17 PM.  Appropriate orders placed.  Allison [name] was informed that the remainder of the evaluation will be completed by another provider, this initial triage assessment does not replace that evaluation, and the importance of remaining in the ED until their evaluation is complete.  Patient meets sepsis criteria, code sepsis initiated.  Discussed with triage nurse, needs to come back to next available room.   Prince Rome, PA-C 41/66/06 1323

## 2022-06-11 NOTE — ED Provider Notes (Signed)
[facility] EMERGENCY DEPARTMENT Provider Note   CSN: [id number] Arrival date & time: 06/11/22  1158     History  Chief Complaint  Patient presents with   Shortness of Breath   Chest Pain    Allison [name] is a 81 y.o. female.  81 year old female with prior medical history as detailed below presents for evaluation of cough and persistent shortness of breath.  Patient with complaint of persistent pneumonia diagnosed around October 10.  Patient reports that she finished a course of Levaquin yesterday.  She reports that her symptoms have not improved.  Cough is mildly productive of sputum.  She denies fever.  She reports intermittent right-sided pleuritic discomfort.  She was previously diagnosed with pleural effusion on the right.  The history is provided by the patient and medical records.  Chest Pain      Home Medications Prior to Admission medications   Medication Sig Start Date End Date Taking? Authorizing Provider  Multiple Vitamin (MULTIVITAMIN) tablet Take 1 tablet by mouth daily.   Yes [provider]  naproxen sodium (ANAPROX) 220 MG tablet Take 220 mg by mouth 2 (two) times daily as needed (pain).   Yes [provider]  Rosuvastatin Calcium 40 MG CPSP Take 40 mg by mouth daily.   Yes [provider]  TRELEGY ELLIPTA 100-62.5-25 MCG/ACT AEPB Take 1 puff by mouth daily. 05/30/22  Yes [provider]  hydrochlorothiazide (HYDRODIURIL) 25 MG tablet Take 25 mg by mouth daily. Patient not taking: Reported on 06/11/2022    [provider]  levofloxacin (LEVAQUIN) 750 MG tablet Take 1 tablet (750 mg total) by mouth daily. 06/03/22   Fredia Sorrow, MD  lidocaine (LIDODERM) 5 % Place 1 patch onto the skin daily. Remove & Discard patch within 12 hours or as directed by MD 05/10/22   Drenda Freeze, MD      Allergies    Cefaclor, Minocycline hcl, Nitrofurantoin, and Sucralfate    Review of Systems   Review  of Systems  Cardiovascular:  Positive for chest pain.  All other systems reviewed and are negative.   Physical Exam Updated Vital Signs BP 128/66 (BP Location: Right Arm)   Pulse 95   Temp 98.3 F (36.8 C) (Oral)   Resp (!) 30   Ht 5\' 3"  (1.6 m)   Wt 64 kg   SpO2 93%   BMI 24.98 kg/m  Physical Exam Vitals and nursing note reviewed.  Constitutional:      General: She is not in acute distress.    Appearance: She is well-developed.  HENT:     Head: Normocephalic and atraumatic.  Eyes:     Conjunctiva/sclera: Conjunctivae normal.  Cardiovascular:     Rate and Rhythm: Normal rate and regular rhythm.     Heart sounds: No murmur heard. Pulmonary:     Effort: Pulmonary effort is normal. No respiratory distress.     Breath sounds: Examination of the right-lower field reveals decreased breath sounds. Decreased breath sounds present.  Abdominal:     Palpations: Abdomen is soft.     Tenderness: There is no abdominal tenderness.  Musculoskeletal:        General: No swelling.     Cervical back: Neck supple.  Skin:    General: Skin is warm and dry.     Capillary Refill: Capillary refill takes less than 2 seconds.  Neurological:     Mental Status: She is alert.  Psychiatric:  Mood and Affect: Mood normal.     ED Results / Procedures / Treatments   Labs (all labs ordered are listed, but only abnormal results are displayed) Labs Reviewed  CBC WITH DIFFERENTIAL/PLATELET - Abnormal; Notable for the following components:      Result Value   WBC 12.0 (*)    RBC 5.16 (*)    Neutro Abs 9.5 (*)    Monocytes Absolute 1.2 (*)    All other components within normal limits  CULTURE, BLOOD (ROUTINE X 2)  CULTURE, BLOOD (ROUTINE X 2)  RESP PANEL BY RT-PCR (FLU A&B, COVID) ARPGX2  URINE CULTURE  LACTIC ACID, PLASMA  PROTIME-INR  APTT  COMPREHENSIVE METABOLIC PANEL  LACTIC ACID, PLASMA  URINALYSIS, ROUTINE W REFLEX MICROSCOPIC  BRAIN NATRIURETIC PEPTIDE  TROPONIN I (HIGH  SENSITIVITY)    EKG None  Radiology DG Chest 2 View  Result Date: 06/11/2022 CLINICAL DATA:  Shortness of breath and chest pain EXAM: CHEST - 2 VIEW COMPARISON:  06/10/2022 FINDINGS: Persistence and probable slight further enlargement of pleural effusion on the right. Compressive volume loss of the right lower lung. Left lung shows mild chronic markings but no evidence of infiltrate, collapse or effusion on that side. IMPRESSION: Probable slight further enlargement of the right pleural effusion. Compressive volume loss of the right lower lung. Electronically Signed   By: Nelson Chimes M.D.   On: 06/11/2022 13:43   DG Chest Right Decubitus  Result Date: 06/10/2022 CLINICAL DATA:  Pneumonia.  Short of breath EXAM: CHEST - RIGHT DECUBITUS COMPARISON:  Chest two-view 06/10/2022.  CT chest 06/03/2022 FINDINGS: Moderately large right pleural effusion which is layering. The effusion appears partially loculated on CT. There is consolidation in the right lung base. Left lung clear. IMPRESSION: Moderately large right pleural effusion which layers. This may be partially loculated based on the recent CT. Electronically Signed   By: Franchot Gallo M.D.   On: 06/10/2022 17:33   DG Chest 2 View  Result Date: 06/10/2022 CLINICAL DATA:  Pneumonia. EXAM: CHEST - 2 VIEW COMPARISON:  CT chest 06/03/2022.  Chest two-view 06/02/2022 FINDINGS: Moderate right pleural effusion slightly larger. Right lower lobe and right middle lobe consolidation unchanged from the prior chest x-ray. Left lung clear. Negative for heart failure. Atherosclerotic calcification aortic arch. IMPRESSION: Mild progression of right pleural effusion which is moderately large. Right lower lobe consolidation right middle lobe consolidation unchanged. Electronically Signed   By: Franchot Gallo M.D.   On: 06/10/2022 17:32    Procedures Procedures    Medications Ordered in ED Medications - No data to display  ED Course/ Medical Decision Making/  A&P                           Medical Decision Making Amount and/or Complexity of Data Reviewed Labs: ordered. Radiology: ordered.    Medical Screen Complete  This patient presented to the ED with complaint of shortness of breath, cough.  This complaint involves an extensive number of treatment options. The initial differential diagnosis includes, but is not limited to, pneumonia, empyema, pleural effusion, metabolic abnormality, etc.  This presentation is: Acute, Chronic, Self-Limited, Previously Undiagnosed, Uncertain Prognosis, Complicated, Systemic Symptoms, and Threat to Life/Bodily Function  Patient is presenting with complaint of persistent cough, shortness of breath, and previously diagnosed pleural effusion.  Patient reports completing course of Levaquin yesterday without improvement in symptoms.  Patient is a noted to be mildly hypoxic with room air sats around  90%.  Patient with persistent right-sided pleural effusion.  Broad-spectrum antibiotics administered here in the ED.  Patient would benefit from admission and further work-up of her pleural effusion and persistent shortness of breath.  Hospital service made aware of case and will evaluate for admission.  Additional history obtained: External records from outside sources obtained and reviewed including prior ED visits and prior Inpatient records.    Lab Tests:  I ordered and personally interpreted labs.  The pertinent results include: CBC, CMP, troponin, lactic acid, blood culture   Imaging Studies ordered:  I ordered imaging studies including chest x-ray, CT chest I independently visualized and interpreted obtained imaging which showed large right-sided pleural effusion I agree with the radiologist interpretation.   Cardiac Monitoring:  The patient was maintained on a cardiac monitor.  I personally viewed and interpreted the cardiac monitor which showed an underlying rhythm of: NSR   Medicines  ordered:  I ordered medication including antibiotics for suspected pneumonia Reevaluation of the patient after these medicines showed that the patient: stayed the same   Problem List / ED Course:  Pleural effusion, shortness of breath   Reevaluation:  After the interventions noted above, I reevaluated the patient and found that they have: improved  Disposition:  After consideration of the diagnostic results and the patients response to treatment, I feel that the patent would benefit from admission.   CRITICAL CARE Performed by: Valarie Merino   Total critical care time: 30 minutes  Critical care time was exclusive of separately billable procedures and treating other patients.  Critical care was necessary to treat or prevent imminent or life-threatening deterioration.  Critical care was time spent personally by me on the following activities: development of treatment plan with patient and/or surrogate as well as nursing, discussions with consultants, evaluation of patient's response to treatment, examination of patient, obtaining history from patient or surrogate, ordering and performing treatments and interventions, ordering and review of laboratory studies, ordering and review of radiographic studies, pulse oximetry and re-evaluation of patient's condition.          Final Clinical Impression(s) / ED Diagnoses Final diagnoses:  SOB (shortness of breath)  Pleural effusion    Rx / DC Orders ED Discharge Orders     None         Valarie Merino, MD 06/11/22 1448

## 2022-06-11 NOTE — ED Notes (Signed)
O2 saturation 93% on RA placed on 2L for comfort

## 2022-06-11 NOTE — H&P (Signed)
History and Physical    Patient: Allison [name] HDQ:[id number] DOB: Feb 16, 1941 DOA: 06/11/2022 DOS: the patient was seen and examined on 06/11/2022 PCP: Seward Carol, MD  Patient coming from: via EMS  Chief Complaint:  Chief Complaint  Patient presents with   Shortness of Breath   Chest Pain   HPI: Allison [name] is a 81 y.o. female with medical history significant of hypertension, hyperlipidemia, CAD, COPD, DM type II, PVD, and tobacco abuse who presents with complaints of shortness of breath and cough.  She has been coughing up thick mucus with associated chest discomfort.  Denies having any significant fevers.  She had been seen in the emergency department on 10/10  diagnosed with concern for community-acquired pneumonia where chest x-ray revealed moderate right-sided pleural effusion with right lower lobe actelectaisis at that time. Discharged home with 7-day course of Levaquin which she states she completed yesterday.  Despite doing this patient reported generalized malaise, weakness, continuous productive cough and shortness of breath even at rest.  Over the last 2 days she has not eaten much of anything and reports needing assistance to stand. Normal she was able to stand and ambulate without assistance.   Upon admission into the emergency department patient was noted to be afebrile with heart rates 93-101, respirations 25-30, and O2 saturations maintained on 2 L nasal cannula oxygen.  Labs noted WBC 12, BNP 76.8, and high-sensitivity troponin 26.  Chest x-ray revealed enlarging right-sided pleural effusion.  Patient has been started on empiric antibiotics of vancomycin and cefepime   Review of Systems: As mentioned in the history of present illness. All other systems reviewed and are negative. Past Medical History:  Diagnosis Date   Arthritis    CAD (coronary artery disease)    COPD (chronic obstructive pulmonary disease) ([place])    Diabetes mellitus without complication ([place])    Type  II   Hyperlipidemia    Hypertension    Peripheral vascular disease ([place])    Past Surgical History:  Procedure Laterality Date   ABDOMINAL HYSTERECTOMY     APPENDECTOMY  2006   bone spur removal     Both feet   PR VEIN BYPASS GRAFT,AORTO-FEM-POP  04-25-2010   Right Fem-Pop BPG   Social History:  reports that she has been smoking cigarettes. She has been smoking an average of .25 packs per day. She has never used smokeless tobacco. She reports that she does not drink alcohol and does not use drugs.  Allergies  Allergen Reactions   Cefaclor Other (See Comments)    UNK reaction, patient says she has never had allergic rxn to antibiotic    Minocycline Hcl Other (See Comments)    Unk reaction   Nitrofurantoin Other (See Comments)    UNK reaction   Sucralfate Other (See Comments)    Unk reaction    Family History  Family history unknown: Yes    Prior to Admission medications   Medication Sig Start Date End Date Taking? Authorizing Provider  Multiple Vitamin (MULTIVITAMIN) tablet Take 1 tablet by mouth daily.   Yes [provider]  naproxen sodium (ANAPROX) 220 MG tablet Take 220 mg by mouth 2 (two) times daily as needed (pain).   Yes [provider]  Rosuvastatin Calcium 40 MG CPSP Take 40 mg by mouth daily.   Yes [provider]  TRELEGY ELLIPTA 100-62.5-25 MCG/ACT AEPB Take 1 puff by mouth daily. 05/30/22  Yes [provider]  hydrochlorothiazide (HYDRODIURIL) 25 MG tablet Take 25 mg  by mouth daily. Patient not taking: Reported on 06/11/2022    [provider]  levofloxacin (LEVAQUIN) 750 MG tablet Take 1 tablet (750 mg total) by mouth daily. 06/03/22   Fredia Sorrow, MD  lidocaine (LIDODERM) 5 % Place 1 patch onto the skin daily. Remove & Discard patch within 12 hours or as directed by MD 05/10/22   Drenda Freeze, MD    Physical Exam: Vitals:   06/11/22 1355 06/11/22 1403 06/11/22 1415 06/11/22 1440  BP: (!) 158/80  (!)  158/80 (!) 141/82  Pulse:  95 93 96  Resp:   (!) 25 (!) 25  Temp:      TempSrc:      SpO2:  93% 97% 97%  Weight:      Height:        Constitutional: Elderly female who appears to be in no acute distress Eyes: PERRL ENMT: Moist mucous membranes Neck: normal, supple  Respiratory: Decreased aeration most notably on the right lung field.  Currently on 2 L of nasal cannula oxygen with O2 saturations maintained. Cardiovascular: Mildly tachycardic, no murmurs / rubs / gallops. No extremity edema. 2+ pedal pulses.   Abdomen: no tenderness, no masses palpated.  Bowel sounds positive.  Musculoskeletal: no clubbing / cyanosis. No joint deformity upper and lower extremities. Good ROM, no contractures. Normal muscle tone.  Skin: no rashes, lesions, ulcers.   Neurologic: CN 2-12 grossly intact.  Strength 5/5 in all 4.  Psychiatric: Normal judgment and insight. Alert and oriented x 3. Normal mood.   Data Reviewed:  EKG reveals sinus tachycardia with premature atrial complexes 102 bpm.  Reviewed labs imaging and pertinent records as noted above in HPI.  Assessment and Plan: Acute respiratory distress secondary to pleural effusion Patient presents with complaints of cough and worsening shortness of breath over the last week despite completing 7 days of Levaquin for presumed pneumonia.  Patient has been placed on 2 L nasal cannula for comfort.  Patient had initially been started on empiric antibiotics of vancomycin and cefepime.  CT revealed signs of a loculated right-sided pleural effusion with associated pleural nodularity and nodular fistular thickening concerning for malignant pleural effusion. -Admit to a telemetry bed -Incentive spirometry and flutter valve -Add on procalcitonin -Check CT scan of the abdomen and pelvis -Continue vancomycin and cefepime.  De-escalate when medically appropriate -PCCM consulted for need of thoracentesis with fluid analysis and cytology  SIRS Acute.  Patient was  noted to be tachycardic and tachypneic with WBC 12 meeting SIRS criteria.  Suspect secondary to above.  Blood cultures have been obtained. -Follow-up blood cultures -Check CBC tomorrow morning  Elevated troponin High-sensitivity troponin 26-> 20.  EKG without significant ischemic changes.  Thought secondary to demand in the setting of respiratory distress. -Continue to monitor  Renal insufficiency Creatinine 1.15 with BUN 19. -Recheck kidney function tomorrow morning  COPD, without acute exacerbation Patient without acute wheezing on physical exam. -Pharmacy substitution Trelegy Ellipta -Albuterol nebs as needed for shortness of breath/wheezing  Peripheral vascular disease Patient with prior history of right leg claudication requiring right femoral-popliteal bypass grafting in 2011. -Continue statin  History of diabetes mellitus  Patient is not on any medication for treatment.  Initial glucose 128. -Continue to monitor and consider placing on sliding scale insulin if blood sugars rise greater than 180  Hyperlipidemia -Continue Crestor  Lymphadenopathy Acute.  CT noted enlarged right costophrenic angle lymph nodes for which showed question of possible metastatic disease.  Tobacco abuse Patient still  reports smoking quarter pack cigarettes per day on average. -Nicotine patch offered  DVT prophylaxis: Lovenox Advance Care Planning:   Code Status: Full Code   Consults: PCCM  Family Communication: Husband updated at bedside  Severity of Illness: The appropriate patient status for this patient is INPATIENT. Inpatient status is judged to be reasonable and necessary in order to provide the required intensity of service to ensure the patient's safety. The patient's presenting symptoms, physical exam findings, and initial radiographic and laboratory data in the context of their chronic comorbidities is felt to place them at high risk for further clinical deterioration. Furthermore,  it is not anticipated that the patient will be medically stable for discharge from the hospital within 2 midnights of admission.   * I certify that at the point of admission it is my clinical judgment that the patient will require inpatient hospital care spanning beyond 2 midnights from the point of admission due to high intensity of service, high risk for further deterioration and high frequency of surveillance required.*  Author: Norval Morton, MD 06/11/2022 2:48 PM  For on call review [email or web address].

## 2022-06-11 NOTE — ED Notes (Signed)
Purewick placed on patient.

## 2022-06-11 NOTE — Progress Notes (Signed)
Pharmacy Antibiotic Note  Allison [name] is a 81 y.o. female admitted on 06/11/2022 with pneumonia.  Pharmacy has been consulted for cefepime and vancomycin dosing. Patient presents with shortness of breath, afebrile, and WBC: 12.0. Patient with AKI, current Scr: 1.15, baseline is approximately 0.8-0.9.   Plan: Start cefepime 2g every 12 hours.  Give IV Vancomycin 1250mg  x 1 for loading dose, followed by IV Vancomycin 750 every 24 hours for eAUC 545.   Height: 5\' 3"  (160 cm) Weight: 64 kg (141 lb) IBW/kg (Calculated) : 52.4  Temp (24hrs), Avg:98.3 F (36.8 C), Min:98.3 F (36.8 C), Max:98.3 F (36.8 C)  Recent Labs  Lab 06/11/22 1301  WBC 12.0*  CREATININE 1.15*  LATICACIDVEN 1.4    Estimated Creatinine Clearance: 33.4 mL/min (A) (by C-G formula based on SCr of 1.15 mg/dL (H)).    Allergies  Allergen Reactions   Cefaclor Other (See Comments)    UNK reaction   Minocycline Hcl Other (See Comments)    Unk reaction   Nitrofurantoin Other (See Comments)    UNK reaction   Sucralfate Other (See Comments)    Unk reaction    Thank you for allowing pharmacy to be a part of this patient's care.  Ventura Sellers 06/11/2022 2:21 PM

## 2022-06-11 NOTE — ED Notes (Signed)
Per admitting, allow chest tube to drain without specified rate notification

## 2022-06-11 NOTE — ED Notes (Signed)
Patient transported to CT 

## 2022-06-11 NOTE — ED Triage Notes (Signed)
EMS reports patient SOBx 2 days and sats were 91% placed on 2L [place]  Hx of COPD.  Dx with PNA on 10th.  Crackles in RLL.

## 2022-06-11 NOTE — ED Notes (Addendum)
Dr. Valeta Harms at bedside to place chest tube, consent signed and at bedside. Time out performed to ensure right pt with name and birthday with studies available

## 2022-06-11 NOTE — Consult Note (Signed)
NAME:  Allison [name], MRN:  [id number], DOB:  May 05, 1941, LOS: 0 ADMISSION DATE:  06/11/2022, CONSULTATION DATE:  s88/18 REFERRING MD:  [place], CHIEF COMPLAINT:  pleural effusion   History of Present Illness:  This is a delightful 81 year old female patient with past medical history as mentioned below.  Actually seen in the emergency room on 10/10 with chief complaint of about 2-week history of cough, productive of thick mucus, progressively worsening pleuritic right-sided chest pain, and increased shortness of breath.  At that time a CT of chest was obtained which showed a fairly large loculated right pleural effusion with associated lung collapse, she was treated with a working diagnosis of "pneumonia" and sent home on Levaquin with directions to follow-up with her primary care provider.  She took her antibiotics as prescribed, reported that the cough initially improved a little bit, however over the last couple days has once again worsened, and chest pain worsened as well.  She had been seen in routine follow-up by her primary care provider on 10/18 as directed by the ER team.  An x-ray was obtained showing a large right effusion once again and she was directed by her primary care provider this morning to go to the emergency room for further evaluation. Pertinent  Medical History  HTN, HL, CAD, COPD, DM type II, PVD.  Tobacco abuse Treated for PNA 10/10 sent home on abx  A@ that time there was a large loculated process  Significant Hospital Events: Including procedures, antibiotic start and stop dates in addition to other pertinent events   CT chest large loculated Right effusion. This was actually present on 10/10.  Right-sided chest tube placed  Interim History / Subjective:  Reports pleuritic type chest discomfort Objective   Blood pressure (Abnormal) 141/82, pulse 96, temperature 98.3 F (36.8 C), temperature source Oral, resp. rate (Abnormal) 25, height 5\' 3"  (1.6 m), weight 64 kg, SpO2  97 %.       No intake or output data in the 24 hours ending 06/11/22 1552 Filed Weights   06/11/22 1244  Weight: 64 kg    Examination: General: Pleasant 81 year old female patient resting in bed she is in no acute distress HENT: Normocephalic atraumatic no jugular venous distention is appreciated Lungs: Remarkably diminished on the right side no accessory use currently on room air Cardiovascular: Mildly tachycardic regular rhythm Abdomen: Soft not tender Extremities: Warm dry brisk capillary refill Neuro: Awake alert and without focal deficits GU: Due to void  Resolved Hospital Problem list     Assessment & Plan:  Large loculated right pleural effusion.  Differential diagnosis includes empyema/parapneumonic process, or even consider malignancy.  She has a history of essentially lifelong smoking Plan We will place right-sided chest tube Send fluid for infection, inflammation, and malignancy analysis She almost certainly will need intrapleural thrombolytics, if we have decent pleural drainage from chest tube tonight we will repeat chest x-ray in the morning, depending on results we will consider lytics at that time, if limited pleural output from chest tube tonight we will go ahead and initiate intrapleural lytics this afternoon Would place her on empiric antibiotics Supplemental oxygen and analgesia as needed Reasonable to cycle procalcitonin We will continue to follow for chest tube management and pleural effusion evaluation  Best Practice (right click and "Reselect all SmartList Selections" daily)  Per primary  Labs   CBC: Recent Labs  Lab 06/11/22 1301  WBC 12.0*  NEUTROABS 9.5*  HGB 14.9  HCT 43.1  MCV 83.5  PLT 409    Basic Metabolic Panel: Recent Labs  Lab 06/11/22 1301  NA 137  K 4.1  CL 100  CO2 26  GLUCOSE 128*  BUN 19  CREATININE 1.15*  CALCIUM 9.4   GFR: Estimated Creatinine Clearance: 33.4 mL/min (A) (by C-G formula based on SCr of 1.15 mg/dL  (H)). Recent Labs  Lab 06/11/22 1301  WBC 12.0*  LATICACIDVEN 1.4    Liver Function Tests: Recent Labs  Lab 06/11/22 1301  AST 29  ALT 22  ALKPHOS 69  BILITOT 0.2*  PROT 6.7  ALBUMIN 2.7*   No results for input(s): "LIPASE", "AMYLASE" in the last 168 hours. No results for input(s): "AMMONIA" in the last 168 hours.  ABG    Component Value Date/Time   TCO2 24 04/10/2010 0816     Coagulation Profile: Recent Labs  Lab 06/11/22 1301  INR 1.1    Cardiac Enzymes: No results for input(s): "CKTOTAL", "CKMB", "CKMBINDEX", "TROPONINI" in the last 168 hours.  HbA1C: No results found for: "HGBA1C"  CBG: No results for input(s): "GLUCAP" in the last 168 hours.  Review of Systems:   Review of Systems  Constitutional:  Positive for malaise/fatigue and weight loss. Negative for fever.  HENT: Negative.    Eyes: Negative.   Respiratory:  Positive for cough, sputum production and shortness of breath.   Cardiovascular:  Positive for chest pain.  Gastrointestinal: Negative.   Genitourinary: Negative.   Musculoskeletal: Negative.   Skin: Negative.   Neurological: Negative.   Endo/Heme/Allergies: Negative.      Past Medical History:  She,  has a past medical history of Arthritis, CAD (coronary artery disease), COPD (chronic obstructive pulmonary disease) ([place]), Diabetes mellitus without complication ([place]), Hyperlipidemia, Hypertension, and Peripheral vascular disease ([place]).   Surgical History:   Past Surgical History:  Procedure Laterality Date   ABDOMINAL HYSTERECTOMY     APPENDECTOMY  2006   bone spur removal     Both feet   PR VEIN BYPASS GRAFT,AORTO-FEM-POP  04-25-2010   Right Fem-Pop BPG     Social History:   reports that she has been smoking cigarettes. She has been smoking an average of .25 packs per day. She has never used smokeless tobacco. She reports that she does not drink alcohol and does not use drugs.   Family History:  Her Family history is unknown  by patient.   Allergies Allergies  Allergen Reactions   Cefaclor Other (See Comments)    UNK reaction, patient says she has never had allergic rxn to antibiotic    Minocycline Hcl Other (See Comments)    Unk reaction   Nitrofurantoin Other (See Comments)    UNK reaction   Sucralfate Other (See Comments)    Unk reaction     Home Medications  Prior to Admission medications   Medication Sig Start Date End Date Taking? Authorizing Provider  Multiple Vitamin (MULTIVITAMIN) tablet Take 1 tablet by mouth daily.   Yes [provider]  naproxen sodium (ANAPROX) 220 MG tablet Take 220 mg by mouth 2 (two) times daily as needed (pain).   Yes [provider]  Rosuvastatin Calcium 40 MG CPSP Take 40 mg by mouth daily.   Yes [provider]  TRELEGY ELLIPTA 100-62.5-25 MCG/ACT AEPB Take 1 puff by mouth daily. 05/30/22  Yes [provider]  hydrochlorothiazide (HYDRODIURIL) 25 MG tablet Take 25 mg by mouth daily. Patient not taking: Reported on 06/11/2022    [provider]  levofloxacin (LEVAQUIN) 750 MG  tablet Take 1 tablet (750 mg total) by mouth daily. 06/03/22   Fredia Sorrow, MD  lidocaine (LIDODERM) 5 % Place 1 patch onto the skin daily. Remove & Discard patch within 12 hours or as directed by MD 05/10/22   Drenda Freeze, MD     Critical care time: NA    Erick Colace ACNP-BC [place] Pager # [phone] OR # [phone] if no answer

## 2022-06-11 NOTE — ED Notes (Signed)
Pt provided with coffee and a sandwich

## 2022-06-11 NOTE — Procedures (Addendum)
Insertion of Chest Tube Procedure Note  Allison [name]  [id number]  1941/07/08  Date:06/11/22  Time:5:59 PM    Provider Performing: Allison [name] Allison [name]   Procedure: Chest Tube Insertion (33354)  Indication(s) Effusion  Consent Risks of the procedure as well as the alternatives and risks of each were explained to the patient and/or caregiver.  Consent for the procedure was obtained and is signed in the bedside chart  Anesthesia Topical only with 1% lidocaine   Time Out Verified patient identification, verified procedure, site/side was marked, verified correct patient position, special equipment/implants available, medications/allergies/relevant history reviewed, required imaging and test results available.  Sterile Technique Maximal sterile technique including full sterile barrier drape, hand hygiene, sterile gown, sterile gloves, mask, hair covering, sterile ultrasound probe cover (if used).  Procedure Description Ultrasound used to identify appropriate pleural anatomy for placement and overlying skin marked. Area of placement cleaned and draped in sterile fashion.  A 14 French pigtail pleural catheter was placed into the right pleural space using Seldinger technique. Appropriate return of fluid was obtained.  The tube was connected to atrium and placed on -20 cm H2O wall suction.  Complications/Tolerance None; patient tolerated the procedure well. Chest X-ray is ordered to verify placement.  EBL Minimal  Specimen(s) none  ~1500cc drained within the first 1 hour  Allison Nash, DO [facility] 06/11/2022 6:00 PM

## 2022-06-12 ENCOUNTER — Inpatient Hospital Stay (HOSPITAL_COMMUNITY): Payer: Medicare HMO

## 2022-06-12 DIAGNOSIS — J90 Pleural effusion, not elsewhere classified: Secondary | ICD-10-CM | POA: Diagnosis not present

## 2022-06-12 DIAGNOSIS — J449 Chronic obstructive pulmonary disease, unspecified: Secondary | ICD-10-CM | POA: Diagnosis not present

## 2022-06-12 DIAGNOSIS — Z9689 Presence of other specified functional implants: Secondary | ICD-10-CM | POA: Diagnosis not present

## 2022-06-12 DIAGNOSIS — J189 Pneumonia, unspecified organism: Secondary | ICD-10-CM

## 2022-06-12 LAB — URINALYSIS, ROUTINE W REFLEX MICROSCOPIC
Bilirubin Urine: NEGATIVE
Glucose, UA: NEGATIVE mg/dL
Hgb urine dipstick: NEGATIVE
Ketones, ur: NEGATIVE mg/dL
Leukocytes,Ua: NEGATIVE
Nitrite: NEGATIVE
Protein, ur: 100 mg/dL — AB
Specific Gravity, Urine: 1.025 (ref 1.005–1.030)
pH: 5 (ref 5.0–8.0)

## 2022-06-12 LAB — CBC
HCT: 41 % (ref 36.0–46.0)
Hemoglobin: 14.1 g/dL (ref 12.0–15.0)
MCH: 28.4 pg (ref 26.0–34.0)
MCHC: 34.4 g/dL (ref 30.0–36.0)
MCV: 82.7 fL (ref 80.0–100.0)
Platelets: 271 10*3/uL (ref 150–400)
RBC: 4.96 MIL/uL (ref 3.87–5.11)
RDW: 13.2 % (ref 11.5–15.5)
WBC: 12.9 10*3/uL — ABNORMAL HIGH (ref 4.0–10.5)
nRBC: 0 % (ref 0.0–0.2)

## 2022-06-12 LAB — BASIC METABOLIC PANEL
Anion gap: 10 (ref 5–15)
BUN: 20 mg/dL (ref 8–23)
CO2: 24 mmol/L (ref 22–32)
Calcium: 9.1 mg/dL (ref 8.9–10.3)
Chloride: 102 mmol/L (ref 98–111)
Creatinine, Ser: 1.18 mg/dL — ABNORMAL HIGH (ref 0.44–1.00)
GFR, Estimated: 46 mL/min — ABNORMAL LOW (ref >60)
Glucose, Bld: 152 mg/dL — ABNORMAL HIGH (ref 70–99)
Potassium: 4.1 mmol/L (ref 3.5–5.1)
Sodium: 136 mmol/L (ref 135–145)

## 2022-06-12 LAB — LACTATE DEHYDROGENASE: LDH: 302 U/L — ABNORMAL HIGH (ref 98–192)

## 2022-06-12 LAB — PROTEIN, TOTAL: Total Protein: 6.1 g/dL — ABNORMAL LOW (ref 6.5–8.1)

## 2022-06-12 LAB — LACTIC ACID, PLASMA: Lactic Acid, Venous: 0.9 mmol/L (ref 0.5–1.9)

## 2022-06-12 LAB — PROCALCITONIN: Procalcitonin: <0.1 ng/mL

## 2022-06-12 MED ORDER — SODIUM CHLORIDE (PF) 0.9 % IJ SOLN
10.0000 mg | Freq: Once | INTRAMUSCULAR | Status: AC
  Administered 2022-06-12: 10 mg via INTRAPLEURAL
  Filled 2022-06-12: qty 10

## 2022-06-12 MED ORDER — VANCOMYCIN HCL 750 MG/150ML IV SOLN
750.0000 mg | INTRAVENOUS | Status: DC
  Administered 2022-06-12 – 2022-06-13 (×2): 750 mg via INTRAVENOUS
  Filled 2022-06-12 (×2): qty 150

## 2022-06-12 MED ORDER — SODIUM CHLORIDE 0.9% FLUSH
10.0000 mL | Freq: Three times a day (TID) | INTRAVENOUS | Status: DC
  Administered 2022-06-12 – 2022-06-14 (×4): 10 mL via INTRAPLEURAL

## 2022-06-12 MED ORDER — STERILE WATER FOR INJECTION IJ SOLN
5.0000 mg | Freq: Once | RESPIRATORY_TRACT | Status: AC
  Administered 2022-06-12: 5 mg via INTRAPLEURAL
  Filled 2022-06-12: qty 5

## 2022-06-12 MED ORDER — OXYCODONE HCL 5 MG PO TABS: 5.0000 mg | ORAL_TABLET | ORAL | Status: DC | PRN

## 2022-06-12 MED ORDER — VANCOMYCIN HCL 750 MG/150ML IV SOLN
750.0000 mg | INTRAVENOUS | Status: DC
  Filled 2022-06-12: qty 150

## 2022-06-12 NOTE — Progress Notes (Signed)
PROGRESS NOTE    Allison [name]  IZT:[id number] DOB: June 17, 1941 DOA: 06/11/2022 PCP: Seward Carol, MD   Chief Complaint  Patient presents with   Shortness of Breath   Chest Pain    Brief Narrative:     Allison [name] is a 81 y.o. female with medical history significant of hypertension, hyperlipidemia, CAD, COPD, DM type II, PVD, and tobacco abuse who presents with complaints of shortness of breath and cough.  She has been coughing up thick mucus with associated chest discomfort.  Denies having any significant fevers.  She had been seen in the emergency department on 10/10  diagnosed with concern for community-acquired pneumonia where chest x-ray revealed moderate right-sided pleural effusion with right lower lobe actelectaisis at that time. Discharged home with 7-day course of Levaquin which she states she completed yesterday.  Despite doing this patient reported generalized malaise, weakness, continuous productive cough and shortness of breath even at rest.  Over the last 2 days she has not eaten much of anything and reports needing assistance to stand. Normal she was able to stand and ambulate without assistance.    Upon admission into the emergency department patient was noted to be afebrile with heart rates 93-101, respirations 25-30, and O2 saturations maintained on 2 L nasal cannula oxygen.  Labs noted WBC 12, BNP 76.8, and high-sensitivity troponin 26.  Chest x-ray revealed enlarging right-sided pleural effusion.  Patient has been started on empiric antibiotics of vancomycin and cefepime   Assessment & Plan:   Principal Problem:   Pleural effusion Active Problems:   Respiratory distress   SIRS (systemic inflammatory response syndrome) (HCC)   Elevated troponin   Renal insufficiency   COPD (chronic obstructive pulmonary disease) (HCC)   Peripheral vascular disease, unspecified (HCC)   History of diabetes mellitus   HYPERCHOLESTEROLEMIA   Tobacco abuse   Chest tube in place    Parapneumonic effusion   Acute respiratory distress secondary to pleural effusion -Presents  with dyspnea, hypoxia, requiring oxygen. -Right pleural effusion management per PCCM, status post chest tube insertion -continue with chest tube to suction -Likely will need intrapleural lytics, final decision.  PCCM -Continue with broad-spectrum antibiotics -Pleural effusion work-up and negative for exudate, so far Gram stain and cultures negative  Elevated troponin High-sensitivity troponin 26-> 20.  EKG without significant ischemic changes.  Thought secondary to demand in the setting of respiratory distress.    Renal insufficiency Creatinine 1.15 with BUN 19.   COPD, without acute exacerbation Patient without acute wheezing on physical exam. -Pharmacy substitution Trelegy Ellipta -Albuterol nebs as needed for shortness of breath/wheezing   Peripheral vascular disease Patient with prior history of right leg claudication requiring right femoral-popliteal bypass grafting in 2011. -Continue statin   History of diabetes mellitus  Patient is not on any medication for treatment.  Initial glucose 128. -Continue to monitor and consider placing on sliding scale insulin if blood sugars rise greater than 180   Hyperlipidemia -Continue Crestor   Lymphadenopathy Acute.  CT noted enlarged right costophrenic angle lymph nodes for which showed question of possible metastatic disease.   Tobacco abuse Patient still reports smoking quarter pack cigarettes per day on average. -Nicotine patch offered     DVT prophylaxis: Lovenox Code Status: Full Family Communication: None at bedside  Status is: Inpatient    Consultants:  PCCM   Subjective:  Complains of pain at chest tube insertion site, still reports dyspnea, even though reports it is improved.  Objective: Vitals:   06/12/22  0900 06/12/22 0930 06/12/22 1000 06/12/22 1100  BP: 105/83 (!) 131/57 135/62 136/64  Pulse: 100 82 80 92   Resp: (!) 22 (!) 23 (!) 21 (!) 22  Temp:    97.6 F (36.4 C)  TempSrc:    Oral  SpO2: 94% 97% 99% 96%  Weight:      Height:        Intake/Output Summary (Last 24 hours) at 06/12/2022 1245 Last data filed at 06/12/2022 0147 Gross per 24 hour  Intake 100 ml  Output 1450 ml  Net -1350 ml   Filed Weights   06/11/22 1244  Weight: 64 kg    Examination:  Awake Alert, Oriented X 3, frail, deconditioned Symmetrical Chest wall movement, diminished air entry in the right lung, right-sided pigtail . RRR,No Gallops,Rubs or new Murmurs, No Parasternal Heave +ve B.Sounds, Abd Soft, No tenderness, No rebound - guarding or rigidity. No Cyanosis, Clubbing or edema, No new Rash or bruise    Data Reviewed: I have personally reviewed following labs and imaging studies  CBC: Recent Labs  Lab 06/11/22 1301 06/12/22 0345  WBC 12.0* 12.9*  NEUTROABS 9.5*  --   HGB 14.9 14.1  HCT 43.1 41.0  MCV 83.5 82.7  PLT 305 562    Basic Metabolic Panel: Recent Labs  Lab 06/11/22 1301 06/12/22 0345  NA 137 136  K 4.1 4.1  CL 100 102  CO2 26 24  GLUCOSE 128* 152*  BUN 19 20  CREATININE 1.15* 1.18*  CALCIUM 9.4 9.1    GFR: Estimated Creatinine Clearance: 32.5 mL/min (A) (by C-G formula based on SCr of 1.18 mg/dL (H)).  Liver Function Tests: Recent Labs  Lab 06/11/22 1301 06/12/22 0345  AST 29  --   ALT 22  --   ALKPHOS 69  --   BILITOT 0.2*  --   PROT 6.7 6.1*  ALBUMIN 2.7*  --     CBG: No results for input(s): "GLUCAP" in the last 168 hours.   Recent Results (from the past 240 hour(s))  Resp Panel by RT-PCR (Flu A&B, Covid) Anterior Nasal Swab     Status: None   Collection Time: 06/03/22  9:15 AM   Specimen: Anterior Nasal Swab  Result Value Ref Range Status   SARS Coronavirus 2 by RT PCR NEGATIVE NEGATIVE Final    Comment: (NOTE) SARS-CoV-2 target nucleic acids are NOT DETECTED.  The SARS-CoV-2 RNA is generally detectable in upper respiratory specimens during the  acute phase of infection. The lowest concentration of SARS-CoV-2 viral copies this assay can detect is 138 copies/mL. A negative result does not preclude SARS-Cov-2 infection and should not be used as the sole basis for treatment or other patient management decisions. A negative result may occur with  improper specimen collection/handling, submission of specimen other than nasopharyngeal swab, presence of viral mutation(s) within the areas targeted by this assay, and inadequate number of viral copies(<138 copies/mL). A negative result must be combined with clinical observations, patient history, and epidemiological information. The expected result is Negative.  Fact Sheet for Patients:  EntrepreneurPulse.com.au  Fact Sheet for Healthcare Providers:  IncredibleEmployment.be  This test is no t yet approved or cleared by the [place] FDA and  has been authorized for detection and/or diagnosis of SARS-CoV-2 by FDA under an Emergency Use Authorization (EUA). This EUA will remain  in effect (meaning this test can be used) for the duration of the COVID-19 declaration under Section 564(b)(1) of the Act, 21 U.S.C.section 360bbb-3(b)(1), unless the  authorization is terminated  or revoked sooner.       Influenza A by PCR NEGATIVE NEGATIVE Final   Influenza B by PCR NEGATIVE NEGATIVE Final    Comment: (NOTE) The Xpert Xpress SARS-CoV-2/FLU/RSV plus assay is intended as an aid in the diagnosis of influenza from Nasopharyngeal swab specimens and should not be used as a sole basis for treatment. Nasal washings and aspirates are unacceptable for Xpert Xpress SARS-CoV-2/FLU/RSV testing.  Fact Sheet for Patients: EntrepreneurPulse.com.au  Fact Sheet for Healthcare Providers: IncredibleEmployment.be  This test is not yet approved or cleared by the [place] FDA and has been authorized for detection and/or  diagnosis of SARS-CoV-2 by FDA under an Emergency Use Authorization (EUA). This EUA will remain in effect (meaning this test can be used) for the duration of the COVID-19 declaration under Section 564(b)(1) of the Act, 21 U.S.C. section 360bbb-3(b)(1), unless the authorization is terminated or revoked.  Performed at [facility], [place] [address] [place], [place] [address]   Culture, blood (Routine x 2)     Status: None (Preliminary result)   Collection Time: 06/11/22 12:53 PM   Specimen: BLOOD  Result Value Ref Range Status   Specimen Description BLOOD RIGHT ANTECUBITAL  Final   Special Requests   Final    BOTTLES DRAWN AEROBIC AND ANAEROBIC Blood Culture adequate volume   Culture   Final    NO GROWTH < 24 HOURS Performed at [facility], [place] [address] [place], [place] [address]    Report Status PENDING  Incomplete  Resp Panel by RT-PCR (Flu A&B, Covid) Anterior Nasal Swab     Status: None   Collection Time: 06/11/22  1:21 PM   Specimen: Anterior Nasal Swab  Result Value Ref Range Status   SARS Coronavirus 2 by RT PCR NEGATIVE NEGATIVE Final    Comment: (NOTE) SARS-CoV-2 target nucleic acids are NOT DETECTED.  The SARS-CoV-2 RNA is generally detectable in upper respiratory specimens during the acute phase of infection. The lowest concentration of SARS-CoV-2 viral copies this assay can detect is 138 copies/mL. A negative result does not preclude SARS-Cov-2 infection and should not be used as the sole basis for treatment or other patient management decisions. A negative result may occur with  improper specimen collection/handling, submission of specimen other than nasopharyngeal swab, presence of viral mutation(s) within the areas targeted by this assay, and inadequate number of viral copies(<138 copies/mL). A negative result must be combined with clinical observations, patient history, and epidemiological information. The expected result is Negative.  Fact  Sheet for Patients:  EntrepreneurPulse.com.au  Fact Sheet for Healthcare Providers:  IncredibleEmployment.be  This test is no t yet approved or cleared by the [place] FDA and  has been authorized for detection and/or diagnosis of SARS-CoV-2 by FDA under an Emergency Use Authorization (EUA). This EUA will remain  in effect (meaning this test can be used) for the duration of the COVID-19 declaration under Section 564(b)(1) of the Act, 21 U.S.C.section 360bbb-3(b)(1), unless the authorization is terminated  or revoked sooner.       Influenza A by PCR NEGATIVE NEGATIVE Final   Influenza B by PCR NEGATIVE NEGATIVE Final    Comment: (NOTE) The Xpert Xpress SARS-CoV-2/FLU/RSV plus assay is intended as an aid in the diagnosis of influenza from Nasopharyngeal swab specimens and should not be used as a sole basis for treatment. Nasal washings and aspirates are unacceptable for Xpert Xpress SARS-CoV-2/FLU/RSV testing.  Fact Sheet for Patients: EntrepreneurPulse.com.au  Fact Sheet for Healthcare Providers: IncredibleEmployment.be  This test is not yet approved or cleared by the [place] FDA and has been authorized for detection and/or diagnosis of SARS-CoV-2 by FDA under an Emergency Use Authorization (EUA). This EUA will remain in effect (meaning this test can be used) for the duration of the COVID-19 declaration under Section 564(b)(1) of the Act, 21 U.S.C. section 360bbb-3(b)(1), unless the authorization is terminated or revoked.  Performed at [facility], [place] [address], [place], [place] [address]   Body fluid culture w Gram Stain     Status: None (Preliminary result)   Collection Time: 06/11/22  5:18 PM   Specimen: Body Fluid  Result Value Ref Range Status   Specimen Description FLUID PLEURAL RIGHT  Final   Special Requests Normal  Final   Gram Stain   Final    RARE WBC PRESENT,  PREDOMINANTLY PMN NO ORGANISMS SEEN Performed at [facility], [address], [place], [place] [address]    Culture PENDING  Incomplete   Report Status PENDING  Incomplete  Culture, blood (Routine x 2)     Status: None (Preliminary result)   Collection Time: 06/12/22  3:45 AM   Specimen: BLOOD  Result Value Ref Range Status   Specimen Description BLOOD SITE NOT SPECIFIED  Final   Special Requests   Final    BOTTLES DRAWN AEROBIC AND ANAEROBIC Blood Culture adequate volume   Culture   Final    NO GROWTH < 12 HOURS Performed at [facility], [place] [address] [place],  [address]    Report Status PENDING  Incomplete         Radiology Studies: DG Chest Port 1 View  Result Date: 06/12/2022 CLINICAL DATA:  Chest tube.  Pleural effusion EXAM: PORTABLE CHEST 1 VIEW COMPARISON:  Yesterday FINDINGS: Right chest tube in place. No visible pneumothorax. Increased hazy opacity at the right base which could be pneumonia or superimposed pleural fluid. Interstitial coarsening in the remaining lungs is stable. Stable cardiomegaly and aortic tortuosity. IMPRESSION: Increased density at the right base which could be pleural fluid or pneumonia. Electronically Signed   By: Jorje Guild M.D.   On: 06/12/2022 04:43   DG Chest Port 1 View  Result Date: 06/11/2022 CLINICAL DATA:  Chest tube placement EXAM: PORTABLE CHEST 1 VIEW COMPARISON:  Same-day x-ray FINDINGS: Interval placement of right-sided chest tube with decreased volume of right-sided pleural effusion, now small volume. Improving aeration of the right lung base. Left lung is clear. Normal heart size. Aortic atherosclerosis. No pneumothorax. IMPRESSION: Interval placement of right-sided chest tube with decreased volume of right-sided pleural effusion, now small volume. Improving aeration of the right lung base. Electronically Signed   By: Davina Poke D.O.   On: 06/11/2022 18:00   CT Chest Wo Contrast  Result Date:  06/11/2022 CLINICAL DATA:  Pleural effusion, malignancy suspected EXAM: CT CHEST WITHOUT CONTRAST TECHNIQUE: Multidetector CT imaging of the chest was performed following the standard protocol without IV contrast. RADIATION DOSE REDUCTION: This exam was performed according to the departmental dose-optimization program which includes automated exposure control, adjustment of the mA and/or kV according to patient size and/or use of iterative reconstruction technique. COMPARISON:  CT chest angio dated June 03, 2022 FINDINGS: Cardiovascular: Normal heart size. No pericardial effusion. Severe left main and three-vessel coronary artery calcifications. Normal caliber thoracic aorta with severe calcified plaque. Mediastinum/Nodes: Esophagus and thyroid are unremarkable. Enlarged lymph nodes of the right cardiophrenic angle.  Reference node measuring 1.3 cm on series 4, image 98 and reference node measuring 1.1 cm on image 98. Lungs/Pleura: Central airways are patent. Mild centrilobular emphysema. Moderate to large loculated right pleural effusion with areas of subtle pleural nodular thickening, better appreciated on today's exam due to differences in technique. Nodular thickening of the fissures of the right lung. Linear opacity of the lingula, likely due to scarring or atelectasis. No consolidation or pneumothorax. Upper Abdomen: Bilateral simple appearing partially visualized renal lesions, likely simple cysts with no specific follow-up imaging recommended for these lesions. Low-density thickening of the bilateral adrenal glands, possibly due to adenomas, although metastatic disease is not entirely excluded. Musculoskeletal: Lucent lucent lesion of the first left rib with associated nondisplaced fracture. IMPRESSION: 1. Moderate to large loculated right pleural effusion, increased in size when compared with prior exam. There is associated pleural nodularity and nodular fissural thickening, highly concerning for  malignant pleural effusion. Recommend further evaluation with fluid sampling. 2. Enlarged right cardiophrenic angle lymph nodes, concerning for metastatic disease. 3. Low-density thickening of the bilateral adrenal glands possibly due to adenomas, although metastatic disease can not be excluded. Recommend attention on follow-up. 4. Lucent lesion of the first left rib with associated subacute appearing fracture, concerning for osseous metastatic disease and pathologic fracture. 5. Severe left main and three-vessel coronary artery calcifications. 6. Aortic Atherosclerosis (ICD10-I70.0) and Emphysema (ICD10-J43.9). Electronically Signed   By: Yetta Glassman M.D.   On: 06/11/2022 15:34   DG Chest 2 View  Result Date: 06/11/2022 CLINICAL DATA:  Shortness of breath and chest pain EXAM: CHEST - 2 VIEW COMPARISON:  06/10/2022 FINDINGS: Persistence and probable slight further enlargement of pleural effusion on the right. Compressive volume loss of the right lower lung. Left lung shows mild chronic markings but no evidence of infiltrate, collapse or effusion on that side. IMPRESSION: Probable slight further enlargement of the right pleural effusion. Compressive volume loss of the right lower lung. Electronically Signed   By: Nelson Chimes M.D.   On: 06/11/2022 13:43   DG Chest Right Decubitus  Result Date: 06/10/2022 CLINICAL DATA:  Pneumonia.  Short of breath EXAM: CHEST - RIGHT DECUBITUS COMPARISON:  Chest two-view 06/10/2022.  CT chest 06/03/2022 FINDINGS: Moderately large right pleural effusion which is layering. The effusion appears partially loculated on CT. There is consolidation in the right lung base. Left lung clear. IMPRESSION: Moderately large right pleural effusion which layers. This may be partially loculated based on the recent CT. Electronically Signed   By: Franchot Gallo M.D.   On: 06/10/2022 17:33   DG Chest 2 View  Result Date: 06/10/2022 CLINICAL DATA:  Pneumonia. EXAM: CHEST - 2 VIEW  COMPARISON:  CT chest 06/03/2022.  Chest two-view 06/02/2022 FINDINGS: Moderate right pleural effusion slightly larger. Right lower lobe and right middle lobe consolidation unchanged from the prior chest x-ray. Left lung clear. Negative for heart failure. Atherosclerotic calcification aortic arch. IMPRESSION: Mild progression of right pleural effusion which is moderately large. Right lower lobe consolidation right middle lobe consolidation unchanged. Electronically Signed   By: Franchot Gallo M.D.   On: 06/10/2022 17:32        Scheduled Meds:  enoxaparin (LOVENOX) injection  40 mg Subcutaneous Q24H   fluticasone furoate-vilanterol  1 puff Inhalation Daily   And   umeclidinium bromide  1 puff Inhalation Daily   guaiFENesin  600 mg Oral BID   nicotine  7 mg Transdermal Daily   rosuvastatin  40 mg Oral Daily   sodium  chloride flush  10 mL Intrapleural Q8H   sodium chloride flush  3 mL Intravenous Q12H   Continuous Infusions:  ceFEPime (MAXIPIME) IV Stopped (06/12/22 1148)   vancomycin       LOS: 1 day       Phillips Climes, MD [facility]   To contact the attending provider between 7A-7P or the covering provider during after hours 7P-7A, please log into the web site [email or web address] and access using universal [employer] password for that web site. If you do not have the password, please call the hospital operator.  06/12/2022, 12:45 PM

## 2022-06-12 NOTE — Progress Notes (Addendum)
NAME:  Allison [name], MRN:  [id number], DOB:  Mar 17, 1941, LOS: 1 ADMISSION DATE:  06/11/2022, CONSULTATION DATE:  s79/18 REFERRING MD:  [place], CHIEF COMPLAINT:  pleural effusion   History of Present Illness:  This is a delightful 81 year old female patient with past medical history as mentioned below.  Actually seen in the emergency room on 10/10 with chief complaint of about 2-week history of cough, productive of thick mucus, progressively worsening pleuritic right-sided chest pain, and increased shortness of breath.  At that time a CT of chest was obtained which showed a fairly large loculated right pleural effusion with associated lung collapse, she was treated with a working diagnosis of "pneumonia" and sent home on Levaquin with directions to follow-up with her primary care provider.  She took her antibiotics as prescribed, reported that the cough initially improved a little bit, however over the last couple days has once again worsened, and chest pain worsened as well.  She had been seen in routine follow-up by her primary care provider on 10/18 as directed by the ER team.  An x-ray was obtained showing a large right effusion once again and she was directed by her primary care provider this morning to go to the emergency room for further evaluation. Pertinent  Medical History  HTN, HL, CAD, COPD, DM type II, PVD.  Tobacco abuse Treated for PNA 10/10 sent home on abx  At that time there was a large loculated process  Significant Hospital Events: Including procedures, antibiotic start and stop dates in addition to other pertinent events   CT chest large loculated Right effusion. This was actually present on 10/10.  Right-sided chest tube placed 10/18 10/19 incr R sided opacity.   Interim History / Subjective:   Incr R sided opacity  Wants to go home   Pain at chest tube site  Objective   Blood pressure 125/67, pulse 93, temperature 97.9 F (36.6 C), temperature source Oral, resp. rate  (!) 30, height 5\' 3"  (1.6 m), weight 64 kg, SpO2 95 %.        Intake/Output Summary (Last 24 hours) at 06/12/2022 0918 Last data filed at 06/12/2022 0147 Gross per 24 hour  Intake 100 ml  Output 1450 ml  Net -1350 ml   Filed Weights   06/11/22 1244  Weight: 64 kg    Examination:  General: WDWN elderly F NAD Neuro: AAOx3  HENT: NCAT pink mm  Lungs: Symmetrical chest expansion. R sided pigtail with yellow thin output  Cardiovascular: rrr  Abdomen: soft ndnt  GU: defer   Resolved Hospital Problem list     Assessment & Plan:    Right  sided pleural effusion, exudative  Chest tube in place Pain related to chest tube  -I personally flushed chest tube 1-/19, felt like there was likely a plug. Wonder if this may be catalyst for incr opacity 10/19 on CXR  Plan -cont chest tube to suction -routine chest tube care  -probably would benefit from intrapleural lytics -- will dw attending  -follow fluid cx -cont broad abx   -incr to Oxy 5. And cont PRN APAP. With cr 1.18, a little hesitant to add toradol but if we arent making process with changes to Oxy could consider this for very short time      Best Practice (right click and "Reselect all SmartList Selections" daily)  Per primary  Labs   CBC: Recent Labs  Lab 06/11/22 1301 06/12/22 0345  WBC 12.0* 12.9*  NEUTROABS 9.5*  --  HGB 14.9 14.1  HCT 43.1 41.0  MCV 83.5 82.7  PLT 305 409    Basic Metabolic Panel: Recent Labs  Lab 06/11/22 1301 06/12/22 0345  NA 137 136  K 4.1 4.1  CL 100 102  CO2 26 24  GLUCOSE 128* 152*  BUN 19 20  CREATININE 1.15* 1.18*  CALCIUM 9.4 9.1   GFR: Estimated Creatinine Clearance: 32.5 mL/min (A) (by C-G formula based on SCr of 1.18 mg/dL (H)). Recent Labs  Lab 06/11/22 1301 06/12/22 0345  PROCALCITON  --  <0.10  WBC 12.0* 12.9*  LATICACIDVEN 1.4 0.9    Liver Function Tests: Recent Labs  Lab 06/11/22 1301 06/12/22 0345  AST 29  --   ALT 22  --   ALKPHOS 69  --    BILITOT 0.2*  --   PROT 6.7 6.1*  ALBUMIN 2.7*  --    No results for input(s): "LIPASE", "AMYLASE" in the last 168 hours. No results for input(s): "AMMONIA" in the last 168 hours.  ABG    Component Value Date/Time   TCO2 24 04/10/2010 0816     Coagulation Profile: Recent Labs  Lab 06/11/22 1301  INR 1.1    Cardiac Enzymes: No results for input(s): "CKTOTAL", "CKMB", "CKMBINDEX", "TROPONINI" in the last 168 hours.  HbA1C: No results found for: "HGBA1C"  CBG: No results for input(s): "GLUCAP" in the last 168 hours.  CCT: n/a   Eliseo Gum MSN, AGACNP-BC [place] [phone] Amion for pager  06/12/2022, 9:18 AM

## 2022-06-12 NOTE — Procedures (Signed)
Pleural Fibrinolytic Administration Procedure Note  [facility]  [id number]  09-27-1940  Date:06/12/22  Time:4:38 PM   Provider Performing:Milt Coye D Rollene Rotunda   Procedure: Pleural Fibrinolysis Initial day [phone])  Indication(s) Fibrinolysis of complicated pleural effusion  Consent Risks of the procedure as well as the alternatives and risks of each were explained to the patient and/or caregiver.  Consent for the procedure was obtained.   Anesthesia None   Time Out Verified patient identification, verified procedure, site/side was marked, verified correct patient position, special equipment/implants available, medications/allergies/relevant history reviewed, required imaging and test results available.   Sterile Technique Hand hygiene, gloves   Procedure Description Existing pleural catheter was cleaned and accessed in sterile manner.  10mg  of tPA in 30cc of saline and 5mg  of dornase in 30cc of sterile water were injected into pleural space using existing pleural catheter.  Catheter will be clamped for 1 hour and then placed back to suction.   Complications/Tolerance None; patient tolerated the procedure well.   EBL None   Specimen(s) None  JD Geryl [facility] 06/12/2022, 4:39 PM  Please see [email or web address] for pager details.  From 7A-7P if no response, please call [phone]. After hours, please call ELink [phone].

## 2022-06-13 ENCOUNTER — Inpatient Hospital Stay (HOSPITAL_COMMUNITY): Payer: Medicare HMO

## 2022-06-13 DIAGNOSIS — J90 Pleural effusion, not elsewhere classified: Secondary | ICD-10-CM | POA: Diagnosis not present

## 2022-06-13 DIAGNOSIS — Z9689 Presence of other specified functional implants: Secondary | ICD-10-CM | POA: Diagnosis not present

## 2022-06-13 DIAGNOSIS — J918 Pleural effusion in other conditions classified elsewhere: Secondary | ICD-10-CM | POA: Diagnosis not present

## 2022-06-13 DIAGNOSIS — J189 Pneumonia, unspecified organism: Secondary | ICD-10-CM | POA: Diagnosis not present

## 2022-06-13 DIAGNOSIS — J449 Chronic obstructive pulmonary disease, unspecified: Secondary | ICD-10-CM | POA: Diagnosis not present

## 2022-06-13 LAB — CBC
HCT: 38.8 % (ref 36.0–46.0)
Hemoglobin: 13.7 g/dL (ref 12.0–15.0)
MCH: 28.8 pg (ref 26.0–34.0)
MCHC: 35.3 g/dL (ref 30.0–36.0)
MCV: 81.7 fL (ref 80.0–100.0)
Platelets: 237 10*3/uL (ref 150–400)
RBC: 4.75 MIL/uL (ref 3.87–5.11)
RDW: 13.1 % (ref 11.5–15.5)
WBC: 13 10*3/uL — ABNORMAL HIGH (ref 4.0–10.5)
nRBC: 0 % (ref 0.0–0.2)

## 2022-06-13 LAB — CYTOLOGY - NON PAP

## 2022-06-13 LAB — BASIC METABOLIC PANEL
Anion gap: 9 (ref 5–15)
BUN: 19 mg/dL (ref 8–23)
CO2: 22 mmol/L (ref 22–32)
Calcium: 8.7 mg/dL — ABNORMAL LOW (ref 8.9–10.3)
Chloride: 104 mmol/L (ref 98–111)
Creatinine, Ser: 1.03 mg/dL — ABNORMAL HIGH (ref 0.44–1.00)
GFR, Estimated: 54 mL/min — ABNORMAL LOW (ref >60)
Glucose, Bld: 147 mg/dL — ABNORMAL HIGH (ref 70–99)
Potassium: 3.8 mmol/L (ref 3.5–5.1)
Sodium: 135 mmol/L (ref 135–145)

## 2022-06-13 LAB — URINE CULTURE: Culture: NO GROWTH

## 2022-06-13 LAB — MRSA NEXT GEN BY PCR, NASAL: MRSA by PCR Next Gen: NOT DETECTED

## 2022-06-13 MED ORDER — ADULT MULTIVITAMIN W/MINERALS CH
1.0000 | ORAL_TABLET | Freq: Every day | ORAL | Status: DC
  Administered 2022-06-13 – 2022-06-16 (×4): 1 via ORAL
  Filled 2022-06-13 (×4): qty 1

## 2022-06-13 MED ORDER — ORAL CARE MOUTH RINSE: 15.0000 mL | OROMUCOSAL | Status: DC | PRN

## 2022-06-13 MED ORDER — GLUCERNA SHAKE PO LIQD
237.0000 mL | Freq: Three times a day (TID) | ORAL | Status: DC
  Administered 2022-06-13 – 2022-06-14 (×4): 237 mL via ORAL

## 2022-06-13 NOTE — Progress Notes (Signed)
NAME:  Allison [name], MRN:  [id number], DOB:  1940-11-23, LOS: 2 ADMISSION DATE:  06/11/2022, CONSULTATION DATE:  s27/18 REFERRING MD:  [place], CHIEF COMPLAINT:  pleural effusion   History of Present Illness:  This is a delightful 81 year old female patient with past medical history as mentioned below.  Actually seen in the emergency room on 10/10 with chief complaint of about 2-week history of cough, productive of thick mucus, progressively worsening pleuritic right-sided chest pain, and increased shortness of breath.  At that time a CT of chest was obtained which showed a fairly large loculated right pleural effusion with associated lung collapse, she was treated with a working diagnosis of "pneumonia" and sent home on Levaquin with directions to follow-up with her primary care provider.  She took her antibiotics as prescribed, reported that the cough initially improved a little bit, however over the last couple days has once again worsened, and chest pain worsened as well.  She had been seen in routine follow-up by her primary care provider on 10/18 as directed by the ER team.  An x-ray was obtained showing a large right effusion once again and she was directed by her primary care provider this morning to go to the emergency room for further evaluation. Pertinent  Medical History  HTN, HL, CAD, COPD, DM type II, PVD.  Tobacco abuse Treated for PNA 10/10 sent home on abx  At that time there was a large loculated process  Significant Hospital Events: Including procedures, antibiotic start and stop dates in addition to other pertinent events   CT chest large loculated Right effusion. This was actually present on 10/10.  Right-sided chest tube placed 10/18 10/19 incr R sided opacity. Tube flushed, unclogged. 1st dose tpa/dornase in afternoon 10/20 overnight >634ml output. Quality of fluid is now sanguinous.   Interim History / Subjective:   Over 637ml out overnight -- total in sahara about 700    Fluid in sahara now serosanguinous (more sanguinous) with frank blood visible in tube proximal to insertion site   Objective   Blood pressure 120/60, pulse 91, temperature 98.1 F (36.7 C), temperature source Oral, resp. rate 17, height 5\' 3"  (1.6 m), weight 64 kg, SpO2 100 %.        Intake/Output Summary (Last 24 hours) at 06/13/2022 1054 Last data filed at 06/13/2022 0422 Gross per 24 hour  Intake 768.63 ml  Output 940 ml  Net -171.37 ml   Filed Weights   06/11/22 1244  Weight: 64 kg    Examination:  General: WDWN elderly F NAD  Neuro: AAOx3 following commands  HENT: NCAT pink mm  Lungs: R sided pigtail, serosanguinous fluid in collection (more sanguinous) and there is frank blood in tubing  Cardiovascular: rrr  Abdomen: thin soft  GU: defer   Resolved Hospital Problem list     Assessment & Plan:    R sided parapneumonic effusion Chest tube in place Pain related to chest tube -rcvd tpa/dornase 10/19. Cxr 10/20 with interval improvement on R sided and there has been a total of approx  749ml out from chest tube, however, quality of fluid has changed and there is now blood draining  Plan -no further lytics -cont chest tube to suction -routine chest tube care -close monitoring of output quantity/quality  -follow pleural cx - -so far no growth  -cont abx -- if MRSA swab neg can prob dc vanc (still pending)  -PRN oxy, PRN APAP -IS, mobility    Best Practice (right click  and "Reselect all SmartList Selections" daily)  Per primary  Labs   CBC: Recent Labs  Lab 06/11/22 1301 06/12/22 0345 06/13/22 0242  WBC 12.0* 12.9* 13.0*  NEUTROABS 9.5*  --   --   HGB 14.9 14.1 13.7  HCT 43.1 41.0 38.8  MCV 83.5 82.7 81.7  PLT 305 271 798    Basic Metabolic Panel: Recent Labs  Lab 06/11/22 1301 06/12/22 0345 06/13/22 0242  NA 137 136 135  K 4.1 4.1 3.8  CL 100 102 104  CO2 26 24 22   GLUCOSE 128* 152* 147*  BUN 19 20 19   CREATININE 1.15* 1.18* 1.03*   CALCIUM 9.4 9.1 8.7*   GFR: Estimated Creatinine Clearance: 37.2 mL/min (A) (by C-G formula based on SCr of 1.03 mg/dL (H)). Recent Labs  Lab 06/11/22 1301 06/12/22 0345 06/13/22 0242  PROCALCITON  --  <0.10  --   WBC 12.0* 12.9* 13.0*  LATICACIDVEN 1.4 0.9  --     Liver Function Tests: Recent Labs  Lab 06/11/22 1301 06/12/22 0345  AST 29  --   ALT 22  --   ALKPHOS 69  --   BILITOT 0.2*  --   PROT 6.7 6.1*  ALBUMIN 2.7*  --    No results for input(s): "LIPASE", "AMYLASE" in the last 168 hours. No results for input(s): "AMMONIA" in the last 168 hours.  ABG    Component Value Date/Time   TCO2 24 04/10/2010 0816     Coagulation Profile: Recent Labs  Lab 06/11/22 1301  INR 1.1    Cardiac Enzymes: No results for input(s): "CKTOTAL", "CKMB", "CKMBINDEX", "TROPONINI" in the last 168 hours.  HbA1C: No results found for: "HGBA1C"  CBG: No results for input(s): "GLUCAP" in the last 168 hours.  CCT: n/a  Eliseo Gum MSN, AGACNP-BC [place] for pager 06/13/2022, 10:54 AM

## 2022-06-13 NOTE — Progress Notes (Signed)
   06/13/22 1100  Clinical Encounter Type  Visited With Patient  Visit Type Initial;Spiritual support  Referral From Nurse  Consult/Referral To Chaplain   Chaplain responded to a spiritual consult for prayer. The patient, Allison [name], asked that I return later it was not a good time. I wished her a restful afternoon as I departed.   Danice Goltz [facility]  [phone]

## 2022-06-13 NOTE — Progress Notes (Signed)
PROGRESS NOTE    Allison [name]  ENI:[id number] DOB: 1941-02-24 DOA: 06/11/2022 PCP: Seward Carol, MD   Chief Complaint  Patient presents with   Shortness of Breath   Chest Pain    Brief Narrative:     Allison [name] is a 81 y.o. female with medical history significant of hypertension, hyperlipidemia, CAD, COPD, DM type II, PVD, and tobacco abuse who presents with complaints of shortness of breath and cough.  She has been coughing up thick mucus with associated chest discomfort.  Denies having any significant fevers.  She had been seen in the emergency department on 10/10  diagnosed with concern for community-acquired pneumonia where chest x-ray revealed moderate right-sided pleural effusion with right lower lobe actelectaisis at that time. Discharged home with 7-day course of Levaquin which she states she completed yesterday.  Despite doing this patient reported generalized malaise, weakness, continuous productive cough and shortness of breath even at rest.  Over the last 2 days she has not eaten much of anything and reports needing assistance to stand. Normal she was able to stand and ambulate without assistance.    Upon admission into the emergency department patient was noted to be afebrile with heart rates 93-101, respirations 25-30, and O2 saturations maintained on 2 L nasal cannula oxygen.  Labs noted WBC 12, BNP 76.8, and high-sensitivity troponin 26.  Chest x-ray revealed enlarging right-sided pleural effusion.  Patient has been started on empiric antibiotics of vancomycin and cefepime   Assessment & Plan:   Principal Problem:   Pleural effusion Active Problems:   Respiratory distress   SIRS (systemic inflammatory response syndrome) (HCC)   Elevated troponin   Renal insufficiency   COPD (chronic obstructive pulmonary disease) (HCC)   Peripheral vascular disease, unspecified (HCC)   History of diabetes mellitus   HYPERCHOLESTEROLEMIA   Tobacco abuse   Chest tube in place    Parapneumonic effusion   Acute respiratory distress secondary to pleural effusion Pleural effusion -Presents  with dyspnea, hypoxia, requiring oxygen. -Right pleural effusion management per PCCM, status post chest tube insertion -continue with chest tube to suction -Status post tPA/lytics 10/19 -His x-ray today with interval improvement of right-sided opacity, total of 700 cc output from chest tube -Appears to be more sanguinous output today -Continue with broad-spectrum antibiotics, so far no growth to date, DC IV Vanco if MRSA PCR screen is negative   Elevated troponin High-sensitivity troponin 26-> 20.  EKG without significant ischemic changes.  Thought secondary to demand in the setting of respiratory distress.    Renal insufficiency Creatinine 1.15 with BUN 19.   COPD, without acute exacerbation Patient without acute wheezing on physical exam. -Pharmacy substitution Trelegy Ellipta -Albuterol nebs as needed for shortness of breath/wheezing   Peripheral vascular disease Patient with prior history of right leg claudication requiring right femoral-popliteal bypass grafting in 2011. -Continue statin   History of diabetes mellitus  Patient is not on any medication for treatment.  Initial glucose 128. -Continue to monitor and consider placing on sliding scale insulin if blood sugars rise greater than 180   Hyperlipidemia -Continue Crestor   Lymphadenopathy Acute.  CT noted enlarged right costophrenic angle lymph nodes for which showed question of possible metastatic disease.   Tobacco abuse Patient still reports smoking quarter pack cigarettes per day on average. -Nicotine patch offered     DVT prophylaxis: Lovenox Code Status: Full Family Communication: None at bedside  Status is: Inpatient    Consultants:  PCCM   Subjective:  No significant events overnight, reports pain at chest tube insertion site is controlled, chest tube output is more sanguinous  overnight  Objective: Vitals:   06/12/22 2054 06/13/22 0006 06/13/22 0601 06/13/22 0911  BP: 126/67 (!) 107/55 129/65 120/60  Pulse: (!) 102 97 89 91  Resp: 17 17 17 17   Temp: 98.7 F (37.1 C) 99 F (37.2 C) 98.3 F (36.8 C) 98.1 F (36.7 C)  TempSrc: Oral Oral Oral Oral  SpO2: 100% 98% 99% 100%  Weight:      Height:        Intake/Output Summary (Last 24 hours) at 06/13/2022 1608 Last data filed at 06/13/2022 1500 Gross per 24 hour  Intake 1008.63 ml  Output 1170 ml  Net -161.37 ml   Filed Weights   06/11/22 1244  Weight: 64 kg    Examination:  Awake Alert, Oriented X 3, No new F.N deficits, Normal affect Symmetrical Chest wall movement, right-sided pigtail, with serosanguineous fluid collection RRR,No Gallops,Rubs or new Murmurs, No Parasternal Heave +ve B.Sounds, Abd Soft, No tenderness, No rebound - guarding or rigidity. No Cyanosis, Clubbing or edema, No new Rash or bruise     Data Reviewed: I have personally reviewed following labs and imaging studies  CBC: Recent Labs  Lab 06/11/22 1301 06/12/22 0345 06/13/22 0242  WBC 12.0* 12.9* 13.0*  NEUTROABS 9.5*  --   --   HGB 14.9 14.1 13.7  HCT 43.1 41.0 38.8  MCV 83.5 82.7 81.7  PLT 305 271 086    Basic Metabolic Panel: Recent Labs  Lab 06/11/22 1301 06/12/22 0345 06/13/22 0242  NA 137 136 135  K 4.1 4.1 3.8  CL 100 102 104  CO2 26 24 22   GLUCOSE 128* 152* 147*  BUN 19 20 19   CREATININE 1.15* 1.18* 1.03*  CALCIUM 9.4 9.1 8.7*    GFR: Estimated Creatinine Clearance: 37.2 mL/min (A) (by C-G formula based on SCr of 1.03 mg/dL (H)).  Liver Function Tests: Recent Labs  Lab 06/11/22 1301 06/12/22 0345  AST 29  --   ALT 22  --   ALKPHOS 69  --   BILITOT 0.2*  --   PROT 6.7 6.1*  ALBUMIN 2.7*  --     CBG: No results for input(s): "GLUCAP" in the last 168 hours.   Recent Results (from the past 240 hour(s))  Urine Culture     Status: None   Collection Time: 06/11/22 12:00 PM    Specimen: In/Out Cath Urine  Result Value Ref Range Status   Specimen Description IN/OUT CATH URINE  Final   Special Requests NONE  Final   Culture   Final    NO GROWTH Performed at [facility], [address], [place], [place] [address]    Report Status 06/13/2022 FINAL  Final  Culture, blood (Routine x 2)     Status: None (Preliminary result)   Collection Time: 06/11/22 12:53 PM   Specimen: BLOOD  Result Value Ref Range Status   Specimen Description BLOOD RIGHT ANTECUBITAL  Final   Special Requests   Final    BOTTLES DRAWN AEROBIC AND ANAEROBIC Blood Culture adequate volume   Culture   Final    NO GROWTH 2 DAYS Performed at [facility], [place] [address], [place], [place] [address]    Report Status PENDING  Incomplete  Resp Panel by RT-PCR (Flu A&B, Covid) Anterior Nasal Swab     Status: None   Collection Time: 06/11/22  1:21 PM  Specimen: Anterior Nasal Swab  Result Value Ref Range Status   SARS Coronavirus 2 by RT PCR NEGATIVE NEGATIVE Final    Comment: (NOTE) SARS-CoV-2 target nucleic acids are NOT DETECTED.  The SARS-CoV-2 RNA is generally detectable in upper respiratory specimens during the acute phase of infection. The lowest concentration of SARS-CoV-2 viral copies this assay can detect is 138 copies/mL. A negative result does not preclude SARS-Cov-2 infection and should not be used as the sole basis for treatment or other patient management decisions. A negative result may occur with  improper specimen collection/handling, submission of specimen other than nasopharyngeal swab, presence of viral mutation(s) within the areas targeted by this assay, and inadequate number of viral copies(<138 copies/mL). A negative result must be combined with clinical observations, patient history, and epidemiological information. The expected result is Negative.  Fact Sheet for Patients:  EntrepreneurPulse.com.au  Fact Sheet for Healthcare Providers:   IncredibleEmployment.be  This test is no t yet approved or cleared by the [place] FDA and  has been authorized for detection and/or diagnosis of SARS-CoV-2 by FDA under an Emergency Use Authorization (EUA). This EUA will remain  in effect (meaning this test can be used) for the duration of the COVID-19 declaration under Section 564(b)(1) of the Act, 21 U.S.C.section 360bbb-3(b)(1), unless the authorization is terminated  or revoked sooner.       Influenza A by PCR NEGATIVE NEGATIVE Final   Influenza B by PCR NEGATIVE NEGATIVE Final    Comment: (NOTE) The Xpert Xpress SARS-CoV-2/FLU/RSV plus assay is intended as an aid in the diagnosis of influenza from Nasopharyngeal swab specimens and should not be used as a sole basis for treatment. Nasal washings and aspirates are unacceptable for Xpert Xpress SARS-CoV-2/FLU/RSV testing.  Fact Sheet for Patients: EntrepreneurPulse.com.au  Fact Sheet for Healthcare Providers: IncredibleEmployment.be  This test is not yet approved or cleared by the [place] FDA and has been authorized for detection and/or diagnosis of SARS-CoV-2 by FDA under an Emergency Use Authorization (EUA). This EUA will remain in effect (meaning this test can be used) for the duration of the COVID-19 declaration under Section 564(b)(1) of the Act, 21 U.S.C. section 360bbb-3(b)(1), unless the authorization is terminated or revoked.  Performed at [facility], [place] [address], [place], [place] [address]   Body fluid culture w Gram Stain     Status: None (Preliminary result)   Collection Time: 06/11/22  5:18 PM   Specimen: Body Fluid  Result Value Ref Range Status   Specimen Description FLUID PLEURAL RIGHT  Final   Special Requests Normal  Final   Gram Stain   Final    RARE WBC PRESENT, PREDOMINANTLY PMN NO ORGANISMS SEEN    Culture   Final    NO GROWTH 2 DAYS Performed at [facility] [address] [place], [place] [address]    Report Status PENDING  Incomplete  Culture, blood (Routine x 2)     Status: None (Preliminary result)   Collection Time: 06/12/22  3:45 AM   Specimen: BLOOD  Result Value Ref Range Status   Specimen Description BLOOD SITE NOT SPECIFIED  Final   Special Requests   Final    BOTTLES DRAWN AEROBIC AND ANAEROBIC Blood Culture adequate volume   Culture   Final    NO GROWTH 1 DAY Performed at [facility], [place] [address] [place], [place] [address]    Report Status PENDING  Incomplete  Radiology Studies: DG CHEST PORT 1 VIEW  Result Date: 06/13/2022 CLINICAL DATA:  Pleural effusion.  Chest tube. EXAM: PORTABLE CHEST 1 VIEW COMPARISON:  AP chest 06/12/2022, chest two views 06/11/2022, CT chest 06/11/2022 FINDINGS: Cardiac silhouette and mediastinal contours are within normal limits. Moderate calcification within the aortic arch. Unchanged right pigtail drainage catheter position. The pigtail appears possibly minimally more open compared to prior however this may be due to projection. This may be due to minimal pullback tension on the catheter. No pneumothorax is seen. Mildly improved aeration of the right mid to lower lung, with predominantly horizontal linear density and more mild heterogeneous airspace opacity compared to prior. Likely tiny right pleural effusion, noting loculated right pleural fluid on recent 06/11/2022 CT prior to the chest tube placement. No acute skeletal abnormality. IMPRESSION: 1. Unchanged position of right pigtail drainage catheter. The pigtail appears minimally more open compared to prior. This may be due to projection or minimal pullback tension on the catheter. 2. Mildly improved aeration of the right mid to lower lung. Persistent linear and heterogeneous right basilar airspace opacities. 3. Likely tiny right pleural effusion, noting loculated fluid on recent 06/11/2022 CT. Electronically Signed   By: Yvonne Kendall M.D.   On: 06/13/2022 08:14   DG Chest Port 1 View  Result Date: 06/12/2022 CLINICAL DATA:  Chest tube.  Pleural effusion EXAM: PORTABLE CHEST 1 VIEW COMPARISON:  Yesterday FINDINGS: Right chest tube in place. No visible pneumothorax. Increased hazy opacity at the right base which could be pneumonia or superimposed pleural fluid. Interstitial coarsening in the remaining lungs is stable. Stable cardiomegaly and aortic tortuosity. IMPRESSION: Increased density at the right base which could be pleural fluid or pneumonia. Electronically Signed   By: Jorje Guild M.D.   On: 06/12/2022 04:43   DG Chest Port 1 View  Result Date: 06/11/2022 CLINICAL DATA:  Chest tube placement EXAM: PORTABLE CHEST 1 VIEW COMPARISON:  Same-day x-ray FINDINGS: Interval placement of right-sided chest tube with decreased volume of right-sided pleural effusion, now small volume. Improving aeration of the right lung base. Left lung is clear. Normal heart size. Aortic atherosclerosis. No pneumothorax. IMPRESSION: Interval placement of right-sided chest tube with decreased volume of right-sided pleural effusion, now small volume. Improving aeration of the right lung base. Electronically Signed   By: Davina Poke D.O.   On: 06/11/2022 18:00        Scheduled Meds:  enoxaparin (LOVENOX) injection  40 mg Subcutaneous Q24H   fluticasone furoate-vilanterol  1 puff Inhalation Daily   And   umeclidinium bromide  1 puff Inhalation Daily   guaiFENesin  600 mg Oral BID   nicotine  7 mg Transdermal Daily   rosuvastatin  40 mg Oral Daily   sodium chloride flush  10 mL Intrapleural Q8H   sodium chloride flush  10 mL Intrapleural Q8H   sodium chloride flush  3 mL Intravenous Q12H   Continuous Infusions:  ceFEPime (MAXIPIME) IV 2 g (06/13/22 0858)   vancomycin Stopped (06/12/22 1855)     LOS: 2 days       Phillips Climes, MD [facility]   To contact the attending provider between 7A-7P or the covering  provider during after hours 7P-7A, please log into the web site [email or web address] and access using universal [place] password for that web site. If you do not have the password, please call the hospital operator.  06/13/2022, 4:08 PM

## 2022-06-13 NOTE — Progress Notes (Signed)
Mobility Specialist - Progress Note   06/13/22 1356  Mobility  Activity Refused mobility   Pt refused mobility despite max encouragement. Will follow up.  Larey Seat

## 2022-06-13 NOTE — Evaluation (Signed)
Physical Therapy Evaluation Patient Details Name: Allison [name] MRN: [id number] DOB: 12/18/1940 Today's Date: 06/13/2022  History of Present Illness  Pt is 11 F admitted to [facility] on 06/11/22 for chest pain, SOB, pleural effusion, elevated troponins. Chest tube placed 10/18. Recently presented in ED (10/9) with similar symptoms. PMH to include HTN, HLD, CAD, COPD, T2DM, PVD, arthritis, and tobacco use.   Clinical Impression  Pt presents with an overall decrease in functional mobility secondary to above. PTA, pt was mod independent with a recent decline in function over the last few months. Pt pleasant, considerate, motivated to participate with PT services. Ambulated 105 ft min guard with 3 standing rest breaks. Trial of mobility without supplemental O2, dropped to 87%, re-administered 2L via [place] with reading of >91% when pleth reliable. Educ on importance of mobility after prolonged bed rest. Pt would benefit from continued acute PT services to maximize functional mobility and independence prior to d/c home with HHPT.        Recommendations for follow up therapy are one component of a multi-disciplinary discharge planning process, led by the attending physician.  Recommendations may be updated based on patient status, additional functional criteria and insurance authorization.  Follow Up Recommendations Home health PT      Assistance Recommended at Discharge Set up Supervision/Assistance  Patient can return home with the following  A little help with walking and/or transfers;A little help with bathing/dressing/bathroom;Help with stairs or ramp for entrance;Assist for transportation;Assistance with cooking/housework    Equipment Recommendations Other (comment) (TBD)  Recommendations for Other Services       Functional Status Assessment Patient has had a recent decline in their functional status and demonstrates the ability to make significant improvements in function in a reasonable and  predictable amount of time.     Precautions / Restrictions Precautions Precautions: Fall;Other (comment) Precaution Comments: chest tube, watch O2 Restrictions Weight Bearing Restrictions: No      Mobility  Bed Mobility Overal bed mobility: Needs Assistance Bed Mobility: Supine to Sit     Supine to sit: Supervision, Min assist     General bed mobility comments: Supine > sit supervision towards L side for line mgmt, min assist to R with assist for trunk upright positioning    Transfers Overall transfer level: Needs assistance Equipment used: Rolling walker (2 wheels) Transfers: Sit to/from Stand Sit to Stand: Min guard           General transfer comment: min guard with verbal cues for hand placement, pt required repeated cues and continued to complete transfer with UEs on RW (not push up from bed)    Ambulation/Gait Ambulation/Gait assistance: Min guard Gait Distance (Feet): 105 Feet Assistive device: Rolling walker (2 wheels) Gait Pattern/deviations: Step-through pattern, Decreased stride length Gait velocity: slow guarded gait     General Gait Details: reliant on RW, three standing rest breaks due to pt reported fatigue. Pt demonstrated dual tasking by couting steps during ambulation  Stairs            Wheelchair Mobility    Modified Rankin (Stroke Patients Only)       Balance Overall balance assessment: Needs assistance Sitting-balance support: Single extremity supported, Feet supported, No upper extremity supported Sitting balance-Leahy Scale: Fair Sitting balance - Comments: Prolonged static sitting sitting EOB   Standing balance support: Bilateral upper extremity supported, During functional activity, Reliant on assistive device for balance Standing balance-Leahy Scale: Poor Standing balance comment: Able to intermittently remove 1 hand from RW to  use finger pulse ox                             Pertinent Vitals/Pain Pain  Assessment Pain Assessment: Faces Faces Pain Scale: Hurts little more Pain Location: R sided abdomen/lower chest Pain Descriptors / Indicators: Sharp, Grimacing Pain Intervention(s): Monitored during session, Repositioned    Home Living Family/patient expects to be discharged to:: Private residence Living Arrangements: Spouse/significant other Available Help at Discharge: Family;Available PRN/intermittently Type of Home: House Home Access: Stairs to enter Entrance Stairs-Rails: Can reach both Entrance Stairs-Number of Steps: 2   Home Layout: One level Home Equipment: Rollator (4 wheels) Additional Comments: Pt lives with 53 y/o spouse who is on dialysis    Prior Function Prior Level of Function : Independent/Modified Independent             Mobility Comments: Pt previously participated in silver sneakers, has not been to gym in 3-4 months due to not feeling as healthy. Has been using rollator last few months. ADLs Comments: Pt reports independent     Hand Dominance        Extremity/Trunk Assessment   Upper Extremity Assessment Upper Extremity Assessment: Generalized weakness (functional UE strength with RW grip and AROM arms overhead)    Lower Extremity Assessment Lower Extremity Assessment: Generalized weakness (functional strength during mobility)    Cervical / Trunk Assessment Cervical / Trunk Assessment: Kyphotic  Communication   Communication: No difficulties  Cognition Arousal/Alertness: Awake/alert Behavior During Therapy: WFL for tasks assessed/performed Overall Cognitive Status: No family/caregiver present to determine baseline cognitive functioning                                 General Comments: Pt AOx4, unaware of deficits impacting safety. Unsure if baseline but otherwise pleasant and highly cooperative        General Comments General comments (skin integrity, edema, etc.): Trial of ambualtion without supplemental oxygen, obtained  readings down to 90%. After completion of mobility, SpO2 reading of 87% on RA, reapplied [place] at 2L.    Exercises     Assessment/Plan    PT Assessment Patient needs continued PT services  PT Problem List Decreased strength;Decreased activity tolerance;Decreased safety awareness;Decreased balance;Decreased mobility;Decreased knowledge of use of DME       PT Treatment Interventions DME instruction;Balance training;Gait training;Stair training;Functional mobility training;Therapeutic activities;Therapeutic exercise;Patient/family education    PT Goals (Current goals can be found in the Care Plan section)  Acute Rehab PT Goals Patient Stated Goal: to go home PT Goal Formulation: With patient Time For Goal Achievement: 06/27/22 Potential to Achieve Goals: Good    Frequency Min 3X/week     Co-evaluation               AM-PAC PT "6 Clicks" Mobility  Outcome Measure Help needed turning from your back to your side while in a flat bed without using bedrails?: A Little Help needed moving from lying on your back to sitting on the side of a flat bed without using bedrails?: A Little Help needed moving to and from a bed to a chair (including a wheelchair)?: A Little Help needed standing up from a chair using your arms (e.g., wheelchair or bedside chair)?: A Little Help needed to walk in hospital room?: A Little Help needed climbing 3-5 steps with a railing? : A Lot 6 Click Score: 17    End  of Session Equipment Utilized During Treatment: Gait belt Activity Tolerance: Patient tolerated treatment well Patient left: in chair;with call bell/phone within reach;with chair alarm set;with nursing/sitter in room Nurse Communication: Mobility status PT Visit Diagnosis: Other abnormalities of gait and mobility (R26.89);Muscle weakness (generalized) (M62.81)    Time: 4712-5271 PT Time Calculation (min) (ACUTE ONLY): 39 min   Charges:   PT Evaluation $PT Eval Moderate Complexity: 1 Mod PT  Treatments $Therapeutic Exercise: 8-22 mins   Chipper Oman, SPT  [address] [place] 06/13/2022, 11:07 AM

## 2022-06-13 NOTE — Progress Notes (Signed)
Pt chest tube output 630 since approximately 2200. Elink provider notified. To bedside to see patient. States to notify if output reaches 1500.

## 2022-06-13 NOTE — Progress Notes (Signed)
Initial Nutrition Assessment  DOCUMENTATION CODES:   Non-severe (moderate) malnutrition in context of chronic illness  INTERVENTION:  - Add Glucerna Shake po TID, each supplement provides 220 kcal and 10 grams of protein   - Add MVI q day  NUTRITION DIAGNOSIS:   Moderate Malnutrition related to chronic illness as evidenced by energy intake < 75% for > or equal to 3 months, mild muscle depletion, mild fat depletion.  GOAL:   Patient will meet greater than or equal to 90% of their needs  MONITOR:   PO intake, Supplement acceptance  REASON FOR ASSESSMENT:   Malnutrition Screening Tool    ASSESSMENT:   81 y.o. female admits related to SOB and chest pain. PMH reviewed: HTN, HLD, CAD, COPD, T2DM, PVD, and tobacco abuse.  Meds reviewed. Labs reviewed.   Pt reports that she does not have much of an appetite but that she has also never been a big eater. The pt states that she has not lost any weight. Wts are stable per record. Pt states that she would be open to trying protein shakes. RD will add Glucerna TID.   NUTRITION - FOCUSED PHYSICAL EXAM:  Flowsheet Row Most Recent Value  Orbital Region Mild depletion  Upper Arm Region Mild depletion  Thoracic and Lumbar Region No depletion  Buccal Region Mild depletion  Temple Region Moderate depletion  Clavicle Bone Region Mild depletion  Clavicle and Acromion Bone Region Mild depletion  Scapular Bone Region No depletion  Dorsal Hand Mild depletion  Patellar Region Unable to assess  Anterior Thigh Region Unable to assess  Posterior Calf Region Unable to assess  Edema (RD Assessment) None  Hair Reviewed  Eyes Reviewed  Mouth Reviewed  Skin Reviewed  Nails Reviewed       Diet Order:   Diet Order             Diet Heart Room service appropriate? Yes; Fluid consistency: Thin  Diet effective now                   EDUCATION NEEDS:   Not appropriate for education at this time  Skin:  Skin Assessment: Reviewed RN  Assessment  Last BM:  06/11/22  Height:   Ht Readings from Last 1 Encounters:  06/11/22 5\' 3"  (1.6 m)    Weight:   Wt Readings from Last 1 Encounters:  06/11/22 64 kg    Ideal Body Weight:  52.3 kg  BMI:  Body mass index is 24.98 kg/m.  Estimated Nutritional Needs:   Kcal:  1600-1920 kcals  Protein:  80-95 gm  Fluid:  >/= 1.6 L  Thalia Bloodgood, RD, LDN, CNSC

## 2022-06-13 NOTE — TOC Initial Note (Signed)
Transition of Care [facility]) - Initial/Assessment Note    Patient Details  Name: Allison [name] MRN: [id number] Date of Birth: Jan 15, 1941  [facility] CM/SW Contact:    Marilu Favre, RN Phone Number: 06/13/2022, 12:35 PM  Clinical Narrative:                 Spoke to patient at bedside. Patient from home with husband. Daughter lives close by.  Patient has walker at home.   Discussed PT recommendation for HHPT at discharge. Patient politely declined. She does not feel that she needs HHPT at this time. Explained if she changes her mind before discharge [facility] Team can arrange. If she decides after she is home Dr Delfina [place] office can arrange. Patient voiced understanding.     Expected Discharge Plan: [place] Barriers to Discharge: Continued Medical Work up   Patient Goals and CMS Choice Patient states their goals for this hospitalization and ongoing recovery are:: to return to home CMS Medicare.gov Compare Post Acute Care list provided to:: Patient Choice offered to / list presented to : Patient  Expected Discharge Plan and Services Expected Discharge Plan: [place]   Discharge Planning Services: CM Consult Post Acute Care Choice: [place] arrangements for the past 2 months: Single Family Home                 DME Arranged: N/A         HH Arranged: Refused HH          Prior Living Arrangements/Services Living arrangements for the past 2 months: Single Family Home Lives with:: Spouse Patient language and need for interpreter reviewed:: Yes Do you feel safe going back to the place where you live?: Yes      Need for Family Participation in Patient Care: Yes (Comment) Care giver support system in place?: Yes (comment) Current home services: DME Criminal Activity/Legal Involvement Pertinent to Current Situation/Hospitalization: No - Comment as needed  Activities of Daily Living Home Assistive Devices/Equipment:  Gilford Rile (specify type) ADL Screening (condition at time of admission) Patient's cognitive ability adequate to safely complete daily activities?: Yes Is the patient deaf or have difficulty hearing?: No Does the patient have difficulty seeing, even when wearing glasses/contacts?: No Does the patient have difficulty concentrating, remembering, or making decisions?: No Patient able to express need for assistance with ADLs?: Yes Does the patient have difficulty dressing or bathing?: No Independently performs ADLs?: Yes (appropriate for developmental age) Does the patient have difficulty walking or climbing stairs?: Yes Weakness of Legs: Both Weakness of Arms/Hands: Both  Permission Sought/Granted   Permission granted to share information with : No              Emotional Assessment Appearance:: Appears stated age Attitude/Demeanor/Rapport: Engaged Affect (typically observed): Accepting Orientation: : Oriented to Situation, Oriented to  Time, Oriented to Place, Oriented to Self Alcohol / Substance Use: Not Applicable Psych Involvement: No (comment)  Admission diagnosis:  SOB (shortness of breath) [R06.02] Pleural effusion [J90] Patient Active Problem List   Diagnosis Date Noted   Chest tube in place 06/12/2022   Parapneumonic effusion 06/12/2022   Pleural effusion 06/11/2022   Respiratory distress 06/11/2022   SIRS (systemic inflammatory response syndrome) (HCC) 06/11/2022   Elevated troponin 06/11/2022   Renal insufficiency 06/11/2022   COPD (chronic obstructive pulmonary disease) ([place]) 06/11/2022   History of diabetes mellitus 06/11/2022   Tobacco abuse 06/11/2022   PVD (peripheral vascular disease) (  [place]) 06/27/2014   Aftercare following surgery of the circulatory system 06/27/2014   Aftercare following surgery of the circulatory system, [place] 06/22/2013   Peripheral vascular disease, unspecified ([place]) 06/09/2012   DM 06/08/2007   HYPERCHOLESTEROLEMIA 06/08/2007    HYPERTENSION 06/08/2007   HYPERTENSIVE CARDIOVASCULAR DISEASE, BENIGN 06/08/2007   GERD 06/08/2007   PCP:  Seward Carol, MD Pharmacy:   [facility] [place], [place] AT [address] [address] Phone: [phone] Fax: [phone]     Social Determinants of Health (SDOH) Interventions    Readmission Risk Interventions     No data to display

## 2022-06-14 ENCOUNTER — Inpatient Hospital Stay (HOSPITAL_COMMUNITY): Payer: Medicare HMO

## 2022-06-14 DIAGNOSIS — J189 Pneumonia, unspecified organism: Secondary | ICD-10-CM

## 2022-06-14 DIAGNOSIS — I739 Peripheral vascular disease, unspecified: Secondary | ICD-10-CM | POA: Diagnosis not present

## 2022-06-14 DIAGNOSIS — Z8639 Personal history of other endocrine, nutritional and metabolic disease: Secondary | ICD-10-CM | POA: Diagnosis not present

## 2022-06-14 DIAGNOSIS — J918 Pleural effusion in other conditions classified elsewhere: Secondary | ICD-10-CM

## 2022-06-14 DIAGNOSIS — I2510 Atherosclerotic heart disease of native coronary artery without angina pectoris: Secondary | ICD-10-CM | POA: Diagnosis not present

## 2022-06-14 DIAGNOSIS — Z9689 Presence of other specified functional implants: Secondary | ICD-10-CM | POA: Diagnosis not present

## 2022-06-14 DIAGNOSIS — I4729 Other ventricular tachycardia: Secondary | ICD-10-CM

## 2022-06-14 DIAGNOSIS — E440 Moderate protein-calorie malnutrition: Secondary | ICD-10-CM | POA: Insufficient documentation

## 2022-06-14 DIAGNOSIS — J90 Pleural effusion, not elsewhere classified: Secondary | ICD-10-CM | POA: Diagnosis not present

## 2022-06-14 LAB — CBC
HCT: 38.6 % (ref 36.0–46.0)
Hemoglobin: 13.3 g/dL (ref 12.0–15.0)
MCH: 28.4 pg (ref 26.0–34.0)
MCHC: 34.5 g/dL (ref 30.0–36.0)
MCV: 82.3 fL (ref 80.0–100.0)
Platelets: 264 10*3/uL (ref 150–400)
RBC: 4.69 MIL/uL (ref 3.87–5.11)
RDW: 13.2 % (ref 11.5–15.5)
WBC: 13.7 10*3/uL — ABNORMAL HIGH (ref 4.0–10.5)
nRBC: 0 % (ref 0.0–0.2)

## 2022-06-14 LAB — MAGNESIUM: Magnesium: 2 mg/dL (ref 1.7–2.4)

## 2022-06-14 LAB — BASIC METABOLIC PANEL
Anion gap: 10 (ref 5–15)
BUN: 18 mg/dL (ref 8–23)
CO2: 22 mmol/L (ref 22–32)
Calcium: 8.9 mg/dL (ref 8.9–10.3)
Chloride: 103 mmol/L (ref 98–111)
Creatinine, Ser: 1.09 mg/dL — ABNORMAL HIGH (ref 0.44–1.00)
GFR, Estimated: 50 mL/min — ABNORMAL LOW (ref >60)
Glucose, Bld: 133 mg/dL — ABNORMAL HIGH (ref 70–99)
Potassium: 3.7 mmol/L (ref 3.5–5.1)
Sodium: 135 mmol/L (ref 135–145)

## 2022-06-14 LAB — BODY FLUID CULTURE W GRAM STAIN
Culture: NO GROWTH
Special Requests: NORMAL

## 2022-06-14 MED ORDER — ASPIRIN 81 MG PO TBEC
81.0000 mg | DELAYED_RELEASE_TABLET | Freq: Every day | ORAL | Status: DC
  Administered 2022-06-14 – 2022-06-16 (×3): 81 mg via ORAL
  Filled 2022-06-14 (×3): qty 1

## 2022-06-14 MED ORDER — MAGNESIUM SULFATE IN D5W 1-5 GM/100ML-% IV SOLN
1.0000 g | Freq: Once | INTRAVENOUS | Status: AC
  Administered 2022-06-15: 1 g via INTRAVENOUS
  Filled 2022-06-14: qty 100

## 2022-06-14 MED ORDER — POTASSIUM CHLORIDE CRYS ER 20 MEQ PO TBCR
40.0000 meq | EXTENDED_RELEASE_TABLET | Freq: Once | ORAL | Status: AC
  Administered 2022-06-15: 40 meq via ORAL
  Filled 2022-06-14: qty 2

## 2022-06-14 MED ORDER — METOPROLOL TARTRATE 12.5 MG HALF TABLET
12.5000 mg | ORAL_TABLET | Freq: Two times a day (BID) | ORAL | Status: DC
  Administered 2022-06-14 – 2022-06-16 (×4): 12.5 mg via ORAL
  Filled 2022-06-14 (×4): qty 1

## 2022-06-14 NOTE — Progress Notes (Addendum)
PROGRESS NOTE    Allison [name]  ZJI:[id number] DOB: 01-02-1941 DOA: 06/11/2022 PCP: Seward Carol, MD   Chief Complaint  Patient presents with   Shortness of Breath   Chest Pain    Brief Narrative:     Allison [name] is a 81 y.o. female with medical history significant of hypertension, hyperlipidemia, CAD, COPD, DM type II, PVD, and tobacco abuse who presents with complaints of shortness of breath and cough.  She has been coughing up thick mucus with associated chest discomfort.  Denies having any significant fevers.  She had been seen in the emergency department on 10/10  diagnosed with concern for community-acquired pneumonia where chest x-ray revealed moderate right-sided pleural effusion with right lower lobe actelectaisis at that time. Discharged home with 7-day course of Levaquin which she states she completed yesterday.  Despite doing this patient reported generalized malaise, weakness, continuous productive cough and shortness of breath even at rest.  Over the last 2 days she has not eaten much of anything and reports needing assistance to stand. Normal she was able to stand and ambulate without assistance.    Upon admission into the emergency department patient was noted to be afebrile with heart rates 93-101, respirations 25-30, and O2 saturations maintained on 2 L nasal cannula oxygen.  Labs noted WBC 12, BNP 76.8, and high-sensitivity troponin 26.  Chest x-ray revealed enlarging right-sided pleural effusion.  Patient has been started on empiric antibiotics of vancomycin and cefepime   Assessment & Plan:   Principal Problem:   Pleural effusion Active Problems:   Respiratory distress   SIRS (systemic inflammatory response syndrome) (HCC)   Elevated troponin   Renal insufficiency   COPD (chronic obstructive pulmonary disease) (HCC)   Peripheral vascular disease, unspecified (HCC)   History of diabetes mellitus   HYPERCHOLESTEROLEMIA   Tobacco abuse   Chest tube in place    Parapneumonic effusion   Malnutrition of moderate degree   Acute respiratory distress secondary to pleural effusion Pleural effusion -Presents  with dyspnea, hypoxia, requiring oxygen. -Right pleural effusion management per PCCM, status post chest tube insertion -continue with chest tube to suction -Status post tPA/lytics 10/19 -Chest x-ray appears to be improving, as well has less output today -Continue with IV cefepime, IV vancomycin has been stopped given negative MRSA PCR screen -Management per PCCM.  NSVT -Patient had nonsustained V. tach, she is asymptomatic, will replete potassium as it is 3.7, will check magnesium and 2D echo, and will start low-dose metoprolol. - cards consulted as well  Elevated troponin High-sensitivity troponin 26-> 20.  EKG without significant ischemic changes.  Thought secondary to demand in the setting of respiratory distress.    Renal insufficiency Creatinine 1.15 with BUN 19.   COPD, without acute exacerbation Patient without acute wheezing on physical exam. -Pharmacy substitution Trelegy Ellipta -Albuterol nebs as needed for shortness of breath/wheezing   Peripheral vascular disease Patient with prior history of right leg claudication requiring right femoral-popliteal bypass grafting in 2011. -Continue statin   History of diabetes mellitus  Patient is not on any medication for treatment.  Initial glucose 128. -Continue to monitor and consider placing on sliding scale insulin if blood sugars rise greater than 180   Hyperlipidemia -Continue Crestor   Lymphadenopathy Acute.  CT noted enlarged right costophrenic angle lymph nodes for which showed question of possible metastatic disease.   Tobacco abuse Patient still reports smoking quarter pack cigarettes per day on average. -Nicotine patch offered     DVT  prophylaxis: Lovenox Code Status: Full Family Communication: None at bedside  Status is: Inpatient    Consultants:   PCCM   Subjective:  No significant events overnight, reported pain at chest tube insertion site.  Objective: Vitals:   06/13/22 2355 06/14/22 0435 06/14/22 0801 06/14/22 1005  BP: 115/65 (!) 107/54 (!) 115/59   Pulse: (!) 101 (!) 106 98 98  Resp: 18 17 17 17   Temp: 98.5 F (36.9 C) 98.1 F (36.7 C) 98.8 F (37.1 C)   TempSrc: Oral Oral Oral   SpO2: 97% 94% 94%   Weight:      Height:        Intake/Output Summary (Last 24 hours) at 06/14/2022 1441 Last data filed at 06/14/2022 0910 Gross per 24 hour  Intake 360 ml  Output 840 ml  Net -480 ml   Filed Weights   06/11/22 1244  Weight: 64 kg    Examination:  Awake Alert, Oriented X 3, No new F.N deficits, Normal affect Symmetrical Chest wall movement, right-sided pigtail, with serosanguineous fluid collection, appears to be less bloody today RRR,No Gallops,Rubs or new Murmurs, No Parasternal Heave +ve B.Sounds, Abd Soft, No tenderness, No rebound - guarding or rigidity. No Cyanosis, Clubbing or edema, No new Rash or bruise      Data Reviewed: I have personally reviewed following labs and imaging studies  CBC: Recent Labs  Lab 06/11/22 1301 06/12/22 0345 06/13/22 0242 06/14/22 0243  WBC 12.0* 12.9* 13.0* 13.7*  NEUTROABS 9.5*  --   --   --   HGB 14.9 14.1 13.7 13.3  HCT 43.1 41.0 38.8 38.6  MCV 83.5 82.7 81.7 82.3  PLT 305 271 237 419    Basic Metabolic Panel: Recent Labs  Lab 06/11/22 1301 06/12/22 0345 06/13/22 0242 06/14/22 0243  NA 137 136 135 135  K 4.1 4.1 3.8 3.7  CL 100 102 104 103  CO2 26 24 22 22   GLUCOSE 128* 152* 147* 133*  BUN 19 20 19 18   CREATININE 1.15* 1.18* 1.03* 1.09*  CALCIUM 9.4 9.1 8.7* 8.9    GFR: Estimated Creatinine Clearance: 35.2 mL/min (A) (by C-G formula based on SCr of 1.09 mg/dL (H)).  Liver Function Tests: Recent Labs  Lab 06/11/22 1301 06/12/22 0345  AST 29  --   ALT 22  --   ALKPHOS 69  --   BILITOT 0.2*  --   PROT 6.7 6.1*  ALBUMIN 2.7*  --      CBG: No results for input(s): "GLUCAP" in the last 168 hours.   Recent Results (from the past 240 hour(s))  Urine Culture     Status: None   Collection Time: 06/11/22 12:00 PM   Specimen: In/Out Cath Urine  Result Value Ref Range Status   Specimen Description IN/OUT CATH URINE  Final   Special Requests NONE  Final   Culture   Final    NO GROWTH Performed at [facility], [address], [place], [place] [address]    Report Status 06/13/2022 FINAL  Final  Culture, blood (Routine x 2)     Status: None (Preliminary result)   Collection Time: 06/11/22 12:53 PM   Specimen: BLOOD  Result Value Ref Range Status   Specimen Description BLOOD RIGHT ANTECUBITAL  Final   Special Requests   Final    BOTTLES DRAWN AEROBIC AND ANAEROBIC Blood Culture adequate volume   Culture   Final    NO GROWTH 3 DAYS Performed at [facility],  [address] [place], [place] [address]    Report Status PENDING  Incomplete  Resp Panel by RT-PCR (Flu A&B, Covid) Anterior Nasal Swab     Status: None   Collection Time: 06/11/22  1:21 PM   Specimen: Anterior Nasal Swab  Result Value Ref Range Status   SARS Coronavirus 2 by RT PCR NEGATIVE NEGATIVE Final    Comment: (NOTE) SARS-CoV-2 target nucleic acids are NOT DETECTED.  The SARS-CoV-2 RNA is generally detectable in upper respiratory specimens during the acute phase of infection. The lowest concentration of SARS-CoV-2 viral copies this assay can detect is 138 copies/mL. A negative result does not preclude SARS-Cov-2 infection and should not be used as the sole basis for treatment or other patient management decisions. A negative result may occur with  improper specimen collection/handling, submission of specimen other than nasopharyngeal swab, presence of viral mutation(s) within the areas targeted by this assay, and inadequate number of viral copies(<138 copies/mL). A negative result must be combined with clinical observations,  patient history, and epidemiological information. The expected result is Negative.  Fact Sheet for Patients:  EntrepreneurPulse.com.au  Fact Sheet for Healthcare Providers:  IncredibleEmployment.be  This test is no t yet approved or cleared by the [place] FDA and  has been authorized for detection and/or diagnosis of SARS-CoV-2 by FDA under an Emergency Use Authorization (EUA). This EUA will remain  in effect (meaning this test can be used) for the duration of the COVID-19 declaration under Section 564(b)(1) of the Act, 21 U.S.C.section 360bbb-3(b)(1), unless the authorization is terminated  or revoked sooner.       Influenza A by PCR NEGATIVE NEGATIVE Final   Influenza B by PCR NEGATIVE NEGATIVE Final    Comment: (NOTE) The Xpert Xpress SARS-CoV-2/FLU/RSV plus assay is intended as an aid in the diagnosis of influenza from Nasopharyngeal swab specimens and should not be used as a sole basis for treatment. Nasal washings and aspirates are unacceptable for Xpert Xpress SARS-CoV-2/FLU/RSV testing.  Fact Sheet for Patients: EntrepreneurPulse.com.au  Fact Sheet for Healthcare Providers: IncredibleEmployment.be  This test is not yet approved or cleared by the [place] FDA and has been authorized for detection and/or diagnosis of SARS-CoV-2 by FDA under an Emergency Use Authorization (EUA). This EUA will remain in effect (meaning this test can be used) for the duration of the COVID-19 declaration under Section 564(b)(1) of the Act, 21 U.S.C. section 360bbb-3(b)(1), unless the authorization is terminated or revoked.  Performed at [facility], [place] [address], [place], [place] [address]   Body fluid culture w Gram Stain     Status: None   Collection Time: 06/11/22  5:18 PM   Specimen: Body Fluid  Result Value Ref Range Status   Specimen Description FLUID PLEURAL RIGHT  Final   Special  Requests Normal  Final   Gram Stain   Final    RARE WBC PRESENT, PREDOMINANTLY PMN NO ORGANISMS SEEN    Culture   Final    NO GROWTH 3 DAYS Performed at [facility] [address], [place], [place] [address]    Report Status 06/14/2022 FINAL  Final  Culture, blood (Routine x 2)     Status: None (Preliminary result)   Collection Time: 06/12/22  3:45 AM   Specimen: BLOOD  Result Value Ref Range Status   Specimen Description BLOOD SITE NOT SPECIFIED  Final   Special Requests   Final    BOTTLES DRAWN AEROBIC AND ANAEROBIC Blood Culture adequate  volume   Culture   Final    NO GROWTH 2 DAYS Performed at [facility], [place] [address], [place], [place] [address]    Report Status PENDING  Incomplete  MRSA Next Gen by PCR, Nasal     Status: None   Collection Time: 06/12/22  7:32 PM   Specimen: Nasal Mucosa; Nasal Swab  Result Value Ref Range Status   MRSA by PCR Next Gen NOT DETECTED NOT DETECTED Final    Comment: (NOTE) The GeneXpert MRSA Assay (FDA approved for NASAL specimens only), is one component of a comprehensive MRSA colonization surveillance program. It is not intended to diagnose MRSA infection nor to guide or monitor treatment for MRSA infections. Test performance is not FDA approved in patients less than 83 years old. Performed at [facility], [place] [address], [place], [place] [address]          Radiology Studies: DG CHEST PORT 1 VIEW  Result Date: 06/14/2022 CLINICAL DATA:  Reason for exam: Chest tube in place 2 for 2: Jocelyn EXAM: PORTABLE CHEST - 1 VIEW COMPARISON:  06/13/2022 and three views FINDINGS: Stable pigtail catheter laterally at the right lung base. Probable skin folds project over the right lung, no convincing pneumothorax. Coarse airspace opacities at the right lung base, partially improved since previous. Left lung clear. Heart size and mediastinal contours are within normal limits. Aortic Atherosclerosis (ICD10-170.0). No effusion.  Visualized bones unremarkable. IMPRESSION: 1. Stable right chest tube. No convincing pneumothorax. 2. Partial improvement in right lower lung airspace disease. Electronically Signed   By: Lucrezia Europe M.D.   On: 06/14/2022 09:20   DG CHEST PORT 1 VIEW  Result Date: 06/13/2022 CLINICAL DATA:  Pleural effusion.  Chest tube. EXAM: PORTABLE CHEST 1 VIEW COMPARISON:  AP chest 06/12/2022, chest two views 06/11/2022, CT chest 06/11/2022 FINDINGS: Cardiac silhouette and mediastinal contours are within normal limits. Moderate calcification within the aortic arch. Unchanged right pigtail drainage catheter position. The pigtail appears possibly minimally more open compared to prior however this may be due to projection. This may be due to minimal pullback tension on the catheter. No pneumothorax is seen. Mildly improved aeration of the right mid to lower lung, with predominantly horizontal linear density and more mild heterogeneous airspace opacity compared to prior. Likely tiny right pleural effusion, noting loculated right pleural fluid on recent 06/11/2022 CT prior to the chest tube placement. No acute skeletal abnormality. IMPRESSION: 1. Unchanged position of right pigtail drainage catheter. The pigtail appears minimally more open compared to prior. This may be due to projection or minimal pullback tension on the catheter. 2. Mildly improved aeration of the right mid to lower lung. Persistent linear and heterogeneous right basilar airspace opacities. 3. Likely tiny right pleural effusion, noting loculated fluid on recent 06/11/2022 CT. Electronically Signed   By: Yvonne Kendall M.D.   On: 06/13/2022 08:14        Scheduled Meds:  enoxaparin (LOVENOX) injection  40 mg Subcutaneous Q24H   feeding supplement (GLUCERNA SHAKE)  237 mL Oral TID BM   fluticasone furoate-vilanterol  1 puff Inhalation Daily   And   umeclidinium bromide  1 puff Inhalation Daily   guaiFENesin  600 mg Oral BID   multivitamin with  minerals  1 tablet Oral Daily   nicotine  7 mg Transdermal Daily   rosuvastatin  40 mg Oral Daily   sodium chloride flush  10 mL Intrapleural Q8H   sodium chloride flush  10 mL  Intrapleural Q8H   sodium chloride flush  3 mL Intravenous Q12H   Continuous Infusions:  ceFEPime (MAXIPIME) IV 2 g (06/14/22 1054)     LOS: 3 days       Phillips Climes, MD [facility]   To contact the attending provider between 7A-7P or the covering provider during after hours 7P-7A, please log into the web site [email or web address] and access using universal [place] password for that web site. If you do not have the password, please call the hospital operator.  06/14/2022, 2:41 PM

## 2022-06-14 NOTE — Progress Notes (Signed)
Monitor tech noted runs of V tach. Pt. Alert and has no complaints. MD here to round on patient and was notified.

## 2022-06-14 NOTE — Plan of Care (Signed)
  Problem: Education: Goal: Knowledge of General Education information will improve Description: Including pain rating scale, medication(s)/side effects and non-pharmacologic comfort measures Outcome: Progressing   Problem: Health Behavior/Discharge Planning: Goal: Ability to manage health-related needs will improve Outcome: Progressing   Problem: Clinical Measurements: Goal: Respiratory complications will improve Outcome: Progressing   Problem: Clinical Measurements: Goal: Cardiovascular complication will be avoided Outcome: Progressing   Problem: Activity: Goal: Risk for activity intolerance will decrease Outcome: Progressing   Problem: Nutrition: Goal: Adequate nutrition will be maintained Outcome: Progressing   Problem: Coping: Goal: Level of anxiety will decrease Outcome: Progressing   Problem: Pain Managment: Goal: General experience of comfort will improve Outcome: Progressing   Problem: Safety: Goal: Ability to remain free from injury will improve Outcome: Progressing   Problem: Skin Integrity: Goal: Risk for impaired skin integrity will decrease Outcome: Progressing

## 2022-06-14 NOTE — Consult Note (Addendum)
Cardiology Consultation   Patient ID: ALBERTA LENHARD MRN: [id number]; DOB: July 18, 1941  Admit date: 06/11/2022 Date of Consult: 06/14/2022  PCP:  Seward Carol, [place] Providers Cardiologist:  None        Patient Profile:   SIMAYA LUMADUE is a 81 y.o. female with a hx of COPD, T2DM, PAD, tobacco use who is being seen 06/14/2022 for the evaluation of NSVT at the request of Dr Waldron Labs.  History of Present Illness:   Ms. Pullin is an 81 year old female with above medical history who was admitted on 06/11/2022 after presenting with cough and shortness of breath.  She had been seen in the ED on 10/10 and presentation was concerning for ammonia and chest x-ray showed moderate right-sided pleural effusion.  She was discharged on 7-day course of Levaquin.  She reported worsening shortness of breath, cough, and weakness prompting her to present back to the ED on 10/18.  Chest x-ray showed enlarging right pleural effusion.  Labs notable for WBC 12, BNP 77, troponin 26 > 20.  PCCM was consulted and she underwent chest tube placement.  Started on broad-spectrum antibiotics.  Cultures no growth to date.  Today she had a 16-second run of NSVT.  She denied any symptoms of chest pain, dyspnea, palpitations, or lightheadedness.  She reports she is active, works in her yard.  Denies any exertional chest pain.   Past Medical History:  Diagnosis Date   Arthritis    CAD (coronary artery disease)    COPD (chronic obstructive pulmonary disease) ([place])    Diabetes mellitus without complication ([place])    Type II   Hyperlipidemia    Hypertension    Peripheral vascular disease ([place])     Past Surgical History:  Procedure Laterality Date   ABDOMINAL HYSTERECTOMY     APPENDECTOMY  2006   bone spur removal     Both feet   PR VEIN BYPASS GRAFT,AORTO-FEM-POP  04-25-2010   Right Fem-Pop BPG       Inpatient Medications: Scheduled Meds:  enoxaparin (LOVENOX) injection  40 mg  Subcutaneous Q24H   feeding supplement (GLUCERNA SHAKE)  237 mL Oral TID BM   fluticasone furoate-vilanterol  1 puff Inhalation Daily   And   umeclidinium bromide  1 puff Inhalation Daily   guaiFENesin  600 mg Oral BID   metoprolol tartrate  12.5 mg Oral BID   multivitamin with minerals  1 tablet Oral Daily   nicotine  7 mg Transdermal Daily   rosuvastatin  40 mg Oral Daily   sodium chloride flush  10 mL Intrapleural Q8H   sodium chloride flush  10 mL Intrapleural Q8H   sodium chloride flush  3 mL Intravenous Q12H   Continuous Infusions:  ceFEPime (MAXIPIME) IV 2 g (06/14/22 1054)   PRN Meds: acetaminophen **OR** acetaminophen, albuterol, mouth rinse, oxyCODONE  Allergies:    Allergies  Allergen Reactions   Cefaclor Other (See Comments)    UNK reaction, patient says she has never had allergic rxn to antibiotic    Minocycline Hcl Other (See Comments)    Unk reaction   Nitrofurantoin Other (See Comments)    UNK reaction   Sucralfate Other (See Comments)    Unk reaction    Social History:   Social History   Socioeconomic History   Marital status: Married    Spouse name: Not on file   Number of children: Not on file   Years of education: Not on file  Highest education level: Not on file  Occupational History   Not on file  Tobacco Use   Smoking status: Some Days    Packs/day: 0.25    Types: Cigarettes   Smokeless tobacco: Never  Substance and Sexual Activity   Alcohol use: No    Alcohol/week: 0.0 standard drinks of alcohol   Drug use: No   Sexual activity: Not on file  Other Topics Concern   Not on file  Social History Narrative   Not on file   Social Determinants of Health   Financial Resource Strain: Not on file  Food Insecurity: Not on file  Transportation Needs: Not on file  Physical Activity: Not on file  Stress: Not on file  Social Connections: Not on file  Intimate Partner Violence: Not on file    Family History:    Family History  Family  history unknown: Yes     ROS:  Please see the history of present illness.   All other ROS reviewed and negative.     Physical Exam/Data:   Vitals:   06/13/22 2355 06/14/22 0435 06/14/22 0801 06/14/22 1005  BP: 115/65 (!) 107/54 (!) 115/59   Pulse: (!) 101 (!) 106 98 98  Resp: 18 17 17 17   Temp: 98.5 F (36.9 C) 98.1 F (36.7 C) 98.8 F (37.1 C)   TempSrc: Oral Oral Oral   SpO2: 97% 94% 94%   Weight:      Height:        Intake/Output Summary (Last 24 hours) at 06/14/2022 1524 Last data filed at 06/14/2022 0910 Gross per 24 hour  Intake 360 ml  Output 610 ml  Net -250 ml      06/11/2022   12:44 PM 12/03/2016   10:36 AM 08/03/2015    9:29 AM  Last 3 Weights  Weight (lbs) 141 lb 141 lb 143 lb 6.4 oz  Weight (kg) 63.957 kg 63.957 kg 65.046 kg     Body mass index is 24.98 kg/m.  General:  in no acute distress HEENT: normal Neck: no JVD Cardiac:  normal S1, S2; RRR; no murmur  Lungs:  clear to auscultation bilaterally. Chest tube in place Abd: soft, nontender Ext: no edema Musculoskeletal:  No deformities Skin: warm and dry  Neuro:  no focal abnormalities noted Psych:  Normal affect   EKG:  The EKG was personally reviewed and demonstrates: Normal sinus rhythm, rate 97, no ST abnormalities Telemetry:  Telemetry was personally reviewed and demonstrates: Normal sinus rhythm, NSVT episode x16 seconds  Relevant CV Studies:   Laboratory Data:  High Sensitivity Troponin:   Recent Labs  Lab 06/02/22 2033 06/03/22 0238 06/03/22 1009 06/11/22 1301 06/11/22 1448  TROPONINIHS 64* 75* 57* 26* 20*     Chemistry Recent Labs  Lab 06/12/22 0345 06/13/22 0242 06/14/22 0243  NA 136 135 135  K 4.1 3.8 3.7  CL 102 104 103  CO2 24 22 22   GLUCOSE 152* 147* 133*  BUN 20 19 18   CREATININE 1.18* 1.03* 1.09*  CALCIUM 9.1 8.7* 8.9  GFRNONAA 46* 54* 50*  ANIONGAP 10 9 10     Recent Labs  Lab 06/11/22 1301 06/12/22 0345  PROT 6.7 6.1*  ALBUMIN 2.7*  --   AST 29   --   ALT 22  --   ALKPHOS 69  --   BILITOT 0.2*  --    Lipids No results for input(s): "CHOL", "TRIG", "HDL", "LABVLDL", "LDLCALC", "CHOLHDL" in the last 168 hours.  Hematology Recent Labs  Lab 06/12/22 0345 06/13/22 0242 06/14/22 0243  WBC 12.9* 13.0* 13.7*  RBC 4.96 4.75 4.69  HGB 14.1 13.7 13.3  HCT 41.0 38.8 38.6  MCV 82.7 81.7 82.3  MCH 28.4 28.8 28.4  MCHC 34.4 35.3 34.5  RDW 13.2 13.1 13.2  PLT 271 237 264   Thyroid No results for input(s): "TSH", "FREET4" in the last 168 hours.  BNP Recent Labs  Lab 06/11/22 1253  BNP 76.8    DDimer No results for input(s): "DDIMER" in the last 168 hours.   Radiology/Studies:  DG CHEST PORT 1 VIEW  Result Date: 06/14/2022 CLINICAL DATA:  Reason for exam: Chest tube in place 2 for 2: Jocelyn EXAM: PORTABLE CHEST - 1 VIEW COMPARISON:  06/13/2022 and three views FINDINGS: Stable pigtail catheter laterally at the right lung base. Probable skin folds project over the right lung, no convincing pneumothorax. Coarse airspace opacities at the right lung base, partially improved since previous. Left lung clear. Heart size and mediastinal contours are within normal limits. Aortic Atherosclerosis (ICD10-170.0). No effusion. Visualized bones unremarkable. IMPRESSION: 1. Stable right chest tube. No convincing pneumothorax. 2. Partial improvement in right lower lung airspace disease. Electronically Signed   By: Lucrezia Europe M.D.   On: 06/14/2022 09:20   DG CHEST PORT 1 VIEW  Result Date: 06/13/2022 CLINICAL DATA:  Pleural effusion.  Chest tube. EXAM: PORTABLE CHEST 1 VIEW COMPARISON:  AP chest 06/12/2022, chest two views 06/11/2022, CT chest 06/11/2022 FINDINGS: Cardiac silhouette and mediastinal contours are within normal limits. Moderate calcification within the aortic arch. Unchanged right pigtail drainage catheter position. The pigtail appears possibly minimally more open compared to prior however this may be due to projection. This may be due to  minimal pullback tension on the catheter. No pneumothorax is seen. Mildly improved aeration of the right mid to lower lung, with predominantly horizontal linear density and more mild heterogeneous airspace opacity compared to prior. Likely tiny right pleural effusion, noting loculated right pleural fluid on recent 06/11/2022 CT prior to the chest tube placement. No acute skeletal abnormality. IMPRESSION: 1. Unchanged position of right pigtail drainage catheter. The pigtail appears minimally more open compared to prior. This may be due to projection or minimal pullback tension on the catheter. 2. Mildly improved aeration of the right mid to lower lung. Persistent linear and heterogeneous right basilar airspace opacities. 3. Likely tiny right pleural effusion, noting loculated fluid on recent 06/11/2022 CT. Electronically Signed   By: Yvonne Kendall M.D.   On: 06/13/2022 08:14   DG Chest Port 1 View  Result Date: 06/12/2022 CLINICAL DATA:  Chest tube.  Pleural effusion EXAM: PORTABLE CHEST 1 VIEW COMPARISON:  Yesterday FINDINGS: Right chest tube in place. No visible pneumothorax. Increased hazy opacity at the right base which could be pneumonia or superimposed pleural fluid. Interstitial coarsening in the remaining lungs is stable. Stable cardiomegaly and aortic tortuosity. IMPRESSION: Increased density at the right base which could be pleural fluid or pneumonia. Electronically Signed   By: Jorje Guild M.D.   On: 06/12/2022 04:43   DG Chest Port 1 View  Result Date: 06/11/2022 CLINICAL DATA:  Chest tube placement EXAM: PORTABLE CHEST 1 VIEW COMPARISON:  Same-day x-ray FINDINGS: Interval placement of right-sided chest tube with decreased volume of right-sided pleural effusion, now small volume. Improving aeration of the right lung base. Left lung is clear. Normal heart size. Aortic atherosclerosis. No pneumothorax. IMPRESSION: Interval placement of right-sided chest tube with decreased volume of  right-sided pleural effusion, now  small volume. Improving aeration of the right lung base. Electronically Signed   By: Davina Poke D.O.   On: 06/11/2022 18:00   CT Chest Wo Contrast  Result Date: 06/11/2022 CLINICAL DATA:  Pleural effusion, malignancy suspected EXAM: CT CHEST WITHOUT CONTRAST TECHNIQUE: Multidetector CT imaging of the chest was performed following the standard protocol without IV contrast. RADIATION DOSE REDUCTION: This exam was performed according to the departmental dose-optimization program which includes automated exposure control, adjustment of the mA and/or kV according to patient size and/or use of iterative reconstruction technique. COMPARISON:  CT chest angio dated June 03, 2022 FINDINGS: Cardiovascular: Normal heart size. No pericardial effusion. Severe left main and three-vessel coronary artery calcifications. Normal caliber thoracic aorta with severe calcified plaque. Mediastinum/Nodes: Esophagus and thyroid are unremarkable. Enlarged lymph nodes of the right cardiophrenic angle. Reference node measuring 1.3 cm on series 4, image 98 and reference node measuring 1.1 cm on image 98. Lungs/Pleura: Central airways are patent. Mild centrilobular emphysema. Moderate to large loculated right pleural effusion with areas of subtle pleural nodular thickening, better appreciated on today's exam due to differences in technique. Nodular thickening of the fissures of the right lung. Linear opacity of the lingula, likely due to scarring or atelectasis. No consolidation or pneumothorax. Upper Abdomen: Bilateral simple appearing partially visualized renal lesions, likely simple cysts with no specific follow-up imaging recommended for these lesions. Low-density thickening of the bilateral adrenal glands, possibly due to adenomas, although metastatic disease is not entirely excluded. Musculoskeletal: Lucent lucent lesion of the first left rib with associated nondisplaced fracture. IMPRESSION:  1. Moderate to large loculated right pleural effusion, increased in size when compared with prior exam. There is associated pleural nodularity and nodular fissural thickening, highly concerning for malignant pleural effusion. Recommend further evaluation with fluid sampling. 2. Enlarged right cardiophrenic angle lymph nodes, concerning for metastatic disease. 3. Low-density thickening of the bilateral adrenal glands possibly due to adenomas, although metastatic disease can not be excluded. Recommend attention on follow-up. 4. Lucent lesion of the first left rib with associated subacute appearing fracture, concerning for osseous metastatic disease and pathologic fracture. 5. Severe left main and three-vessel coronary artery calcifications. 6. Aortic Atherosclerosis (ICD10-I70.0) and Emphysema (ICD10-J43.9). Electronically Signed   By: Yetta Glassman M.D.   On: 06/11/2022 15:34   DG Chest 2 View  Result Date: 06/11/2022 CLINICAL DATA:  Shortness of breath and chest pain EXAM: CHEST - 2 VIEW COMPARISON:  06/10/2022 FINDINGS: Persistence and probable slight further enlargement of pleural effusion on the right. Compressive volume loss of the right lower lung. Left lung shows mild chronic markings but no evidence of infiltrate, collapse or effusion on that side. IMPRESSION: Probable slight further enlargement of the right pleural effusion. Compressive volume loss of the right lower lung. Electronically Signed   By: Nelson Chimes M.D.   On: 06/11/2022 13:43   DG Chest Right Decubitus  Result Date: 06/10/2022 CLINICAL DATA:  Pneumonia.  Short of breath EXAM: CHEST - RIGHT DECUBITUS COMPARISON:  Chest two-view 06/10/2022.  CT chest 06/03/2022 FINDINGS: Moderately large right pleural effusion which is layering. The effusion appears partially loculated on CT. There is consolidation in the right lung base. Left lung clear. IMPRESSION: Moderately large right pleural effusion which layers. This may be partially  loculated based on the recent CT. Electronically Signed   By: Franchot Gallo M.D.   On: 06/10/2022 17:33   DG Chest 2 View  Result Date: 06/10/2022 CLINICAL DATA:  Pneumonia. EXAM: CHEST - 2  VIEW COMPARISON:  CT chest 06/03/2022.  Chest two-view 06/02/2022 FINDINGS: Moderate right pleural effusion slightly larger. Right lower lobe and right middle lobe consolidation unchanged from the prior chest x-ray. Left lung clear. Negative for heart failure. Atherosclerotic calcification aortic arch. IMPRESSION: Mild progression of right pleural effusion which is moderately large. Right lower lobe consolidation right middle lobe consolidation unchanged. Electronically Signed   By: Franchot Gallo M.D.   On: 06/10/2022 17:32     Assessment and Plan:   NSVT: 16-second episode noted on telemetry today.  She was asymptomatic.  She has no known cardiac history but does have significant coronary calcifications on recent chest CT -Echocardiogram -Agree with starting metoprolol -Maintain K greater than 4, mag greater than 2 -Continue to monitor on telemetry -D/w Dr Waldron Labs, will transfer from 6N to cardiac tele unit  CAD: Significant coronary calcifications seen on recent CT chest.  Continue rosuvastatin.  Will add aspirin 81 mg daily  PAD: History of peripheral intervention.  Continue statin, will add aspirin  Pleural effusion: Right-sided parapneumonic effusion.  Status post chest tube insertion, PCCM following  Pneumonia: Antibiotics per primary team  For questions or updates, please contact [place] Please consult [email or web address] for contact info under    Signed, Donato Heinz, MD  06/14/2022 3:24 PM

## 2022-06-14 NOTE — Progress Notes (Signed)
NAME:  Allison [name], MRN:  [id number], DOB:  06-Jun-1941, LOS: 3 ADMISSION DATE:  06/11/2022, CONSULTATION DATE:  s52/18 REFERRING MD:  [place], CHIEF COMPLAINT:  pleural effusion   History of Present Illness:  This is a delightful 81 year old female patient with past medical history as mentioned below.  Actually seen in the emergency room on 10/10 with chief complaint of about 2-week history of cough, productive of thick mucus, progressively worsening pleuritic right-sided chest pain, and increased shortness of breath.  At that time a CT of chest was obtained which showed a fairly large loculated right pleural effusion with associated lung collapse, she was treated with a working diagnosis of "pneumonia" and sent home on Levaquin with directions to follow-up with her primary care provider.  She took her antibiotics as prescribed, reported that the cough initially improved a little bit, however over the last couple days has once again worsened, and chest pain worsened as well.  She had been seen in routine follow-up by her primary care provider on 10/18 as directed by the ER team.  An x-ray was obtained showing a large right effusion once again and she was directed by her primary care provider this morning to go to the emergency room for further evaluation. Pertinent  Medical History  HTN, HL, CAD, COPD, DM type II, PVD.  Tobacco abuse Treated for PNA 10/10 sent home on abx  At that time there was a large loculated process  Significant Hospital Events: Including procedures, antibiotic start and stop dates in addition to other pertinent events   CT chest large loculated Right effusion. This was actually present on 10/10.  Right-sided chest tube placed 10/18 10/19 incr R sided opacity. Tube flushed, unclogged. 1st dose tpa/dornase in afternoon 10/20 overnight >620ml output. Quality of fluid is now sanguinous.   Interim History / Subjective:   40 cc drained over the last 24 hours. Only 30 cc since 7  AM Afebrile  Objective   Blood pressure (!) 115/59, pulse 98, temperature 98.8 F (37.1 C), temperature source Oral, resp. rate 17, height 5\' 3"  (1.6 m), weight 64 kg, SpO2 94 %.    FiO2 (%):  [95 %] 95 %   Intake/Output Summary (Last 24 hours) at 06/14/2022 1448 Last data filed at 06/14/2022 0910 Gross per 24 hour  Intake 360 ml  Output 840 ml  Net -480 ml    Filed Weights   06/11/22 1244  Weight: 64 kg    Examination:  General: WDWN elderly F NAD  Neuro: AAOx3 following commands  HENT: NCAT pink mm  Lungs: R sided pigtail, minimal serosanguineous fluid , clear to auscultation bilaterally, no accessory muscle use Cardiovascular: rrr  Abdomen: thin soft   Labs show mild leukocytosis, normal electrolytes Chest x-ray reviewed -minimal right effusion, skinfold projecting over the right lung right Pneumothorax, improved aeration right base  Resolved Hospital Problem list     Assessment & Plan:    R sided parapneumonic effusion Chest tube in place Pain related to chest tube -rcvd tpa/dornase 10/19.  Drainage has decreased and chest x-ray is improved Plan -DC pigtail -follow pleural cx - -so far no growth , switch to oral Augmentin on discharge -DC vancomycin -PRN oxy, PRN APAP -IS, mobility   PCCM will be available as needed. She can follow-up in the pulmonary office for follow-up imaging  Best Practice (right click and "Reselect all SmartList Selections" daily)  Per primary  Labs   CBC: Recent Labs  Lab 06/11/22 1301  06/12/22 0345 06/13/22 0242 06/14/22 0243  WBC 12.0* 12.9* 13.0* 13.7*  NEUTROABS 9.5*  --   --   --   HGB 14.9 14.1 13.7 13.3  HCT 43.1 41.0 38.8 38.6  MCV 83.5 82.7 81.7 82.3  PLT 305 271 237 264     Basic Metabolic Panel: Recent Labs  Lab 06/11/22 1301 06/12/22 0345 06/13/22 0242 06/14/22 0243  NA 137 136 135 135  K 4.1 4.1 3.8 3.7  CL 100 102 104 103  CO2 26 24 22 22   GLUCOSE 128* 152* 147* 133*  BUN 19 20 19 18    CREATININE 1.15* 1.18* 1.03* 1.09*  CALCIUM 9.4 9.1 8.7* 8.9    GFR: Estimated Creatinine Clearance: 35.2 mL/min (A) (by C-G formula based on SCr of 1.09 mg/dL (H)). Recent Labs  Lab 06/11/22 1301 06/12/22 0345 06/13/22 0242 06/14/22 0243  PROCALCITON  --  <0.10  --   --   WBC 12.0* 12.9* 13.0* 13.7*  LATICACIDVEN 1.4 0.9  --   --      Liver Function Tests: Recent Labs  Lab 06/11/22 1301 06/12/22 0345  AST 29  --   ALT 22  --   ALKPHOS 69  --   BILITOT 0.2*  --   PROT 6.7 6.1*  ALBUMIN 2.7*  --     No results for input(s): "LIPASE", "AMYLASE" in the last 168 hours. No results for input(s): "AMMONIA" in the last 168 hours.  ABG    Component Value Date/Time   TCO2 24 04/10/2010 0816     Coagulation Profile: Recent Labs  Lab 06/11/22 1301  INR 1.1     Cardiac Enzymes: No results for input(s): "CKTOTAL", "CKMB", "CKMBINDEX", "TROPONINI" in the last 168 hours.  HbA1C: No results found for: "HGBA1C"  CBG: No results for input(s): "GLUCAP" in the last 168 hours.  Leanna Sato Elsworth Soho MD [place] for pager 06/14/2022, 2:48 PM

## 2022-06-15 ENCOUNTER — Inpatient Hospital Stay (HOSPITAL_COMMUNITY): Payer: Medicare HMO

## 2022-06-15 ENCOUNTER — Other Ambulatory Visit (HOSPITAL_COMMUNITY): Payer: Medicare HMO

## 2022-06-15 DIAGNOSIS — R0602 Shortness of breath: Secondary | ICD-10-CM | POA: Diagnosis not present

## 2022-06-15 DIAGNOSIS — I2510 Atherosclerotic heart disease of native coronary artery without angina pectoris: Secondary | ICD-10-CM | POA: Diagnosis not present

## 2022-06-15 DIAGNOSIS — I4729 Other ventricular tachycardia: Secondary | ICD-10-CM | POA: Diagnosis not present

## 2022-06-15 DIAGNOSIS — J918 Pleural effusion in other conditions classified elsewhere: Secondary | ICD-10-CM | POA: Diagnosis not present

## 2022-06-15 DIAGNOSIS — J90 Pleural effusion, not elsewhere classified: Secondary | ICD-10-CM | POA: Diagnosis not present

## 2022-06-15 DIAGNOSIS — J189 Pneumonia, unspecified organism: Secondary | ICD-10-CM | POA: Diagnosis not present

## 2022-06-15 LAB — BASIC METABOLIC PANEL
Anion gap: 11 (ref 5–15)
BUN: 22 mg/dL (ref 8–23)
CO2: 22 mmol/L (ref 22–32)
Calcium: 9.1 mg/dL (ref 8.9–10.3)
Chloride: 103 mmol/L (ref 98–111)
Creatinine, Ser: 0.97 mg/dL (ref 0.44–1.00)
GFR, Estimated: 58 mL/min — ABNORMAL LOW (ref >60)
Glucose, Bld: 129 mg/dL — ABNORMAL HIGH (ref 70–99)
Potassium: 4.6 mmol/L (ref 3.5–5.1)
Sodium: 136 mmol/L (ref 135–145)

## 2022-06-15 LAB — MAGNESIUM: Magnesium: 2.5 mg/dL — ABNORMAL HIGH (ref 1.7–2.4)

## 2022-06-15 LAB — ECHOCARDIOGRAM COMPLETE
AR max vel: 1.98 cm2
AV Area VTI: 1.89 cm2
AV Area mean vel: 2.01 cm2
AV Mean grad: 5.9 mmHg
AV Peak grad: 11.2 mmHg
Ao pk vel: 1.67 m/s
Area-P 1/2: 3.12 cm2
Calc EF: 74.6 %
Height: 63 in
MV VTI: 2.3 cm2
S' Lateral: 2.3 cm
Single Plane A2C EF: 77.8 %
Single Plane A4C EF: 73.2 %
Weight: 2324.53 oz

## 2022-06-15 LAB — CBC
HCT: 37.4 % (ref 36.0–46.0)
Hemoglobin: 13.1 g/dL (ref 12.0–15.0)
MCH: 28.4 pg (ref 26.0–34.0)
MCHC: 35 g/dL (ref 30.0–36.0)
MCV: 81.1 fL (ref 80.0–100.0)
Platelets: 286 10*3/uL (ref 150–400)
RBC: 4.61 MIL/uL (ref 3.87–5.11)
RDW: 13.2 % (ref 11.5–15.5)
WBC: 12.8 10*3/uL — ABNORMAL HIGH (ref 4.0–10.5)
nRBC: 0.2 % (ref 0.0–0.2)

## 2022-06-15 LAB — TROPONIN I (HIGH SENSITIVITY): Troponin I (High Sensitivity): 24 ng/L — ABNORMAL HIGH (ref <18)

## 2022-06-15 MED ORDER — ALUM & MAG HYDROXIDE-SIMETH 200-200-20 MG/5ML PO SUSP
30.0000 mL | ORAL | Status: DC | PRN
  Administered 2022-06-15: 30 mL via ORAL
  Filled 2022-06-15: qty 30

## 2022-06-15 MED ORDER — AMOXICILLIN-POT CLAVULANATE 875-125 MG PO TABS
1.0000 | ORAL_TABLET | Freq: Two times a day (BID) | ORAL | Status: DC
  Administered 2022-06-16: 1 via ORAL
  Filled 2022-06-15: qty 1

## 2022-06-15 NOTE — Progress Notes (Signed)
  Echocardiogram 2D Echocardiogram has been performed.  Allison [name] 06/15/2022, 1:24 PM

## 2022-06-15 NOTE — Progress Notes (Signed)
Pharmacy Antibiotic Note  Allison [name] is a 81 y.o. female admitted on 06/11/2022 with pneumonia.  Pharmacy has been consulted for cefepime and vancomycin dosing. Patient presents with shortness of breath, afebrile, and WBC: 12.0. Patient with AKI, current Scr: 1.15, baseline is approximately 0.8-0.9.   10/22: Parapneumonic effusion s/p intrapleural tpa/dornase 10/19. Pleural cultures remain negative to date. WBC trending down, afebrile, renal function improving. Plan to continue Cefepime today and switch to Augmentin starting tomorrow per MD.   Plan: Continue cefepime 2g every 12 hours today to end this evening Start Augmentin 875-125 mg PO BID tomorrow AM  F/u pleural cultures, clinical progress, length of therapy   Height: 5\' 3"  (160 cm) Weight: 65.9 kg (145 lb 4.5 oz) IBW/kg (Calculated) : 52.4  Temp (24hrs), Avg:98.6 F (37 C), Min:97.8 F (36.6 C), Max:99.1 F (37.3 C)  Recent Labs  Lab 06/11/22 1301 06/12/22 0345 06/13/22 0242 06/14/22 0243 06/15/22 0542  WBC 12.0* 12.9* 13.0* 13.7* 12.8*  CREATININE 1.15* 1.18* 1.03* 1.09* 0.97  LATICACIDVEN 1.4 0.9  --   --   --      Estimated Creatinine Clearance: 40.1 mL/min (by C-G formula based on SCr of 0.97 mg/dL).    Allergies  Allergen Reactions   Cefaclor Other (See Comments)    UNK reaction, patient says she has never had allergic rxn to antibiotic    Minocycline Hcl Other (See Comments)    Unk reaction   Nitrofurantoin Other (See Comments)    UNK reaction   Sucralfate Other (See Comments)    Unk reaction    Thank you for allowing pharmacy to be a part of this patient's care.  Francena Hanly, PharmD Pharmacy Resident  06/15/2022 11:22 AM

## 2022-06-15 NOTE — Plan of Care (Signed)
  Problem: Education: Goal: Knowledge of General Education information will improve Description: Including pain rating scale, medication(s)/side effects and non-pharmacologic comfort measures Outcome: Progressing   Problem: Health Behavior/Discharge Planning: Goal: Ability to manage health-related needs will improve Outcome: Progressing   Problem: Clinical Measurements: Goal: Respiratory complications will improve Outcome: Progressing   Problem: Clinical Measurements: Goal: Cardiovascular complication will be avoided Outcome: Progressing   Problem: Coping: Goal: Level of anxiety will decrease Outcome: Progressing   Problem: Nutrition: Goal: Adequate nutrition will be maintained Outcome: Progressing   Problem: Pain Managment: Goal: General experience of comfort will improve Outcome: Progressing   Problem: Safety: Goal: Ability to remain free from injury will improve Outcome: Progressing   Problem: Skin Integrity: Goal: Risk for impaired skin integrity will decrease Outcome: Progressing

## 2022-06-15 NOTE — Progress Notes (Signed)
Rounding Note    Patient Name: Allison [name] Date of Encounter: 06/15/2022  [place] Cardiologist: None   Subjective   Reported episode of chest pain that felt like indigestion, resolved with maalox.  Currently no chest pain or dyspnea  Inpatient Medications    Scheduled Meds:  [START ON 06/16/2022] amoxicillin-clavulanate  1 tablet Oral Q12H   aspirin EC  81 mg Oral Daily   enoxaparin (LOVENOX) injection  40 mg Subcutaneous Q24H   feeding supplement (GLUCERNA SHAKE)  237 mL Oral TID BM   fluticasone furoate-vilanterol  1 puff Inhalation Daily   And   umeclidinium bromide  1 puff Inhalation Daily   guaiFENesin  600 mg Oral BID   metoprolol tartrate  12.5 mg Oral BID   multivitamin with minerals  1 tablet Oral Daily   nicotine  7 mg Transdermal Daily   rosuvastatin  40 mg Oral Daily   sodium chloride flush  3 mL Intravenous Q12H   Continuous Infusions:  ceFEPime (MAXIPIME) IV 2 g (06/15/22 1047)   PRN Meds: acetaminophen **OR** acetaminophen, albuterol, alum & mag hydroxide-simeth, mouth rinse, oxyCODONE   Vital Signs    Vitals:   06/15/22 0431 06/15/22 0808 06/15/22 0820 06/15/22 1042  BP: 128/66  127/71 (!) 148/54  Pulse: 94 93 94 94  Resp: 19 (!) 23 17 17   Temp: 97.8 F (36.6 C)  99 F (37.2 C)   TempSrc: Oral  Oral   SpO2: 94% 95% 94% 94%  Weight:      Height:        Intake/Output Summary (Last 24 hours) at 06/15/2022 1221 Last data filed at 06/15/2022 5784 Gross per 24 hour  Intake 360 ml  Output 600 ml  Net -240 ml      06/14/2022    6:33 AM 06/11/2022   12:44 PM 12/03/2016   10:36 AM  Last 3 Weights  Weight (lbs) 145 lb 4.5 oz 141 lb 141 lb  Weight (kg) 65.9 kg 63.957 kg 63.957 kg      Telemetry    NSR, no NSVT - Personally Reviewed  ECG    NSR, rate 85, no ST changes - Personally Reviewed  Physical Exam   GEN: No acute distress.   Neck: No JVD Cardiac: RRR, no murmurs, rubs, or gallops.  Respiratory: Clear to  auscultation bilaterally. GI: Soft, nontender, non-distended  MS: No edema; No deformity. Neuro:  Nonfocal  Psych: Normal affect   Labs    High Sensitivity Troponin:   Recent Labs  Lab 06/02/22 2033 06/03/22 0238 06/03/22 1009 06/11/22 1301 06/11/22 1448  TROPONINIHS 64* 75* 57* 26* 20*     Chemistry Recent Labs  Lab 06/11/22 1301 06/12/22 0345 06/13/22 0242 06/14/22 0243 06/14/22 0307 06/15/22 0542  NA 137 136 135 135  --  136  K 4.1 4.1 3.8 3.7  --  4.6  CL 100 102 104 103  --  103  CO2 26 24 22 22   --  22  GLUCOSE 128* 152* 147* 133*  --  129*  BUN 19 20 19 18   --  22  CREATININE 1.15* 1.18* 1.03* 1.09*  --  0.97  CALCIUM 9.4 9.1 8.7* 8.9  --  9.1  MG  --   --   --   --  2.0 2.5*  PROT 6.7 6.1*  --   --   --   --   ALBUMIN 2.7*  --   --   --   --   --  AST 29  --   --   --   --   --   ALT 22  --   --   --   --   --   ALKPHOS 69  --   --   --   --   --   BILITOT 0.2*  --   --   --   --   --   GFRNONAA 47* 46* 54* 50*  --  58*  ANIONGAP 11 10 9 10   --  11    Lipids No results for input(s): "CHOL", "TRIG", "HDL", "LABVLDL", "LDLCALC", "CHOLHDL" in the last 168 hours.  Hematology Recent Labs  Lab 06/13/22 0242 06/14/22 0243 06/15/22 0542  WBC 13.0* 13.7* 12.8*  RBC 4.75 4.69 4.61  HGB 13.7 13.3 13.1  HCT 38.8 38.6 37.4  MCV 81.7 82.3 81.1  MCH 28.8 28.4 28.4  MCHC 35.3 34.5 35.0  RDW 13.1 13.2 13.2  PLT 237 264 286   Thyroid No results for input(s): "TSH", "FREET4" in the last 168 hours.  BNP Recent Labs  Lab 06/11/22 1253  BNP 76.8    DDimer No results for input(s): "DDIMER" in the last 168 hours.   Radiology    DG CHEST PORT 1 VIEW  Result Date: 06/14/2022 CLINICAL DATA:  Reason for exam: Chest tube in place 2 for 2: Jocelyn EXAM: PORTABLE CHEST - 1 VIEW COMPARISON:  06/13/2022 and three views FINDINGS: Stable pigtail catheter laterally at the right lung base. Probable skin folds project over the right lung, no convincing pneumothorax.  Coarse airspace opacities at the right lung base, partially improved since previous. Left lung clear. Heart size and mediastinal contours are within normal limits. Aortic Atherosclerosis (ICD10-170.0). No effusion. Visualized bones unremarkable. IMPRESSION: 1. Stable right chest tube. No convincing pneumothorax. 2. Partial improvement in right lower lung airspace disease. Electronically Signed   By: Lucrezia Europe M.D.   On: 06/14/2022 09:20    Cardiac Studies     Patient Profile     81 y.o. female with a hx of COPD, T2DM, PAD, tobacco use who is being seen 06/14/2022 for the evaluation of NSVT   Assessment & Plan    NSVT: 16-second episode noted on telemetry 10/21.  She was asymptomatic.  She has no known cardiac history but does have significant coronary calcifications on recent chest CT -Echocardiogram -Continue metoprolol -Maintain K greater than 4, mag greater than 2 -Continue to monitor on telemetry   CAD: Significant coronary calcifications seen on recent CT chest.  Continue rosuvastatin.  Added aspirin 81 mg daily.  Did report episode of chest pain overnight that sounded like indigestion, resolved with maalox.  EKG unremarkable.  Will check troponin.   PAD: History of peripheral intervention.  Continue statin, added aspirin   Pleural effusion: Right-sided parapneumonic effusion.  Status post chest tube    Pneumonia: Antibiotics per primary team  For questions or updates, please contact [place] Please consult [email or web address] for contact info under        Signed, Donato Heinz, MD  06/15/2022, 12:21 PM

## 2022-06-15 NOTE — Progress Notes (Signed)
Patient complaint of on and off chest tightness, 7/10, not radiating. No SOB. EKG done - NSR. Stable vital signs. Per patient, same chest tightness she had during admission. Plan of care ongoing.

## 2022-06-15 NOTE — Progress Notes (Signed)
Physical Therapy Treatment Patient Details Name: Allison [name] MRN: [id number] DOB: [birth year] Today's Date: 06/15/2022   History of Present Illness Pt is 48 F admitted to [facility] on 06/11/22 for chest pain, SOB, pleural effusion, elevated troponins. Chest tube placed 10/18. Recently presented in ED (10/9) with similar symptoms. PMH to include HTN, HLD, CAD, COPD, T2DM, PVD, arthritis, and tobacco use.    PT Comments    Pt tolerates treatment well, ambulates for increased distances without supplemental O2. Pt does continue to report a need for UE support due to LE weakness. PT continues to recommend aggressive mobilizaiton and HHPT.   Recommendations for follow up therapy are one component of a multi-disciplinary discharge planning process, led by the attending physician.  Recommendations may be updated based on patient status, additional functional criteria and insurance authorization.  Follow Up Recommendations  Home health PT     Assistance Recommended at Discharge Set up Supervision/Assistance  Patient can return home with the following A little help with walking and/or transfers;A little help with bathing/dressing/bathroom;Help with stairs or ramp for entrance;Assist for transportation;Assistance with cooking/housework   Equipment Recommendations  Rolling walker (2 wheels)    Recommendations for Other Services       Precautions / Restrictions Precautions Precautions: Fall;Other (comment) Precaution Comments: monitor sats Restrictions Weight Bearing Restrictions: No     Mobility  Bed Mobility Overal bed mobility: Needs Assistance Bed Mobility: Supine to Sit, Sit to Supine     Supine to sit: Supervision, HOB elevated Sit to supine: Min guard        Transfers Overall transfer level: Needs assistance Equipment used: Rolling walker (2 wheels) Transfers: Sit to/from Stand Sit to Stand: Min guard                Ambulation/Gait Ambulation/Gait assistance: Hydrologist (Feet): 150 Feet Assistive device: Rolling walker (2 wheels) Gait Pattern/deviations: Step-through pattern Gait velocity: reduced Gait velocity interpretation: <1.31 ft/sec, indicative of household ambulator   General Gait Details: slowed step-through Radio broadcast assistant    Modified Rankin (Stroke Patients Only)       Balance Overall balance assessment: Needs assistance Sitting-balance support: No upper extremity supported, Feet supported Sitting balance-Leahy Scale: Good     Standing balance support: Bilateral upper extremity supported, Reliant on assistive device for balance Standing balance-Leahy Scale: Poor                              Cognition Arousal/Alertness: Awake/alert Behavior During Therapy: WFL for tasks assessed/performed Overall Cognitive Status: Within Functional Limits for tasks assessed                                          Exercises      General Comments General comments (skin integrity, edema, etc.): pt on 2L [place] at rest, weaned to room air to mobilize, sats from 91-96% when ambulating. Pt returned to [facility] at end of session, RN made aware of performance      Pertinent Vitals/Pain Pain Assessment Pain Assessment: No/denies pain    Home Living                          Prior Function  PT Goals (current goals can now be found in the care plan section) Acute Rehab PT Goals Patient Stated Goal: to go home Progress towards PT goals: Progressing toward goals    Frequency    Min 3X/week      PT Plan Current plan remains appropriate    Co-evaluation              AM-PAC PT "6 Clicks" Mobility   Outcome Measure  Help needed turning from your back to your side while in a flat bed without using bedrails?: A Little Help needed moving from lying on your back to sitting on the side of a flat bed without using bedrails?: A  Little Help needed moving to and from a bed to a chair (including a wheelchair)?: A Little Help needed standing up from a chair using your arms (e.g., wheelchair or bedside chair)?: A Little Help needed to walk in hospital room?: A Little Help needed climbing 3-5 steps with a railing? : A Lot 6 Click Score: 17    End of Session Equipment Utilized During Treatment: Oxygen Activity Tolerance: Patient tolerated treatment well Patient left: in bed;with call bell/phone within reach;with bed alarm set;with family/visitor present Nurse Communication: Mobility status PT Visit Diagnosis: Other abnormalities of gait and mobility (R26.89);Muscle weakness (generalized) (M62.81)     Time: 1331-1401 PT Time Calculation (min) (ACUTE ONLY): 30 min  Charges:  $Gait Training: 8-22 mins $Therapeutic Activity: 8-22 mins                     Zenaida Niece, PT, DPT Acute Rehabilitation Office [place] Lalana Wachter 06/15/2022, 2:11 PM

## 2022-06-15 NOTE — Progress Notes (Signed)
PROGRESS NOTE    Allison [name]  SNK:[id number] DOB: [birth year] DOA: 06/11/2022 PCP: Seward Carol, MD   Chief Complaint  Patient presents with   Shortness of Breath   Chest Pain    Brief Narrative:     Allison [name] is a 81 y.o. female with medical history significant of hypertension, hyperlipidemia, CAD, COPD, DM type II, PVD, and tobacco abuse who presents with complaints of shortness of breath and cough.  She has been coughing up thick mucus with associated chest discomfort.  Denies having any significant fevers.  She had been seen in the emergency department on 10/10  diagnosed with concern for community-acquired pneumonia where chest x-ray revealed moderate right-sided pleural effusion with right lower lobe actelectaisis at that time. Discharged home with 7-day course of Levaquin which she states she completed yesterday.  Despite doing this patient reported generalized malaise, weakness, continuous productive cough and shortness of breath even at rest.  Over the last 2 days she has not eaten much of anything and reports needing assistance to stand. Normal she was able to stand and ambulate without assistance.    Upon admission into the emergency department patient was noted to be afebrile with heart rates 93-101, respirations 25-30, and O2 saturations maintained on 2 L nasal cannula oxygen.  Labs noted WBC 12, BNP 76.8, and high-sensitivity troponin 26.  Chest x-ray revealed enlarging right-sided pleural effusion.  Patient has been started on empiric antibiotics of vancomycin and cefepime   Assessment & Plan:   Principal Problem:   Pleural effusion Active Problems:   Respiratory distress   SIRS (systemic inflammatory response syndrome) (HCC)   Elevated troponin   Renal insufficiency   COPD (chronic obstructive pulmonary disease) (HCC)   Peripheral vascular disease, unspecified (HCC)   History of diabetes mellitus   HYPERCHOLESTEROLEMIA   Tobacco abuse   Chest tube in place    Parapneumonic effusion   Malnutrition of moderate degree   NSVT (nonsustained ventricular tachycardia) (HCC)   Coronary artery disease involving native coronary artery of native heart   Acute respiratory distress secondary to pleural effusion Pleural effusion -Presents  with dyspnea, hypoxia, requiring oxygen. -Right pleural effusion management per PCCM, status post chest tube insertion -continue with chest tube to suction -Status post tPA/lytics 10/19 -Chest x-ray appears to be improving, as well has less output today -Continue with IV cefepime, IV vancomycin has been stopped given negative MRSA PCR screen -Tube discontinued 7/21, transition to oral Augmentin tomorrow.  NSVT -Patient had nonsustained V. tach, she is asymptomatic, started on metoprolol 25 mg oral p.o. twice daily, 2D echo with EF > 93%, grade 1 diastolic dysfunction, keep potassium more than 4 and magnesium more than 2, continue to monitor on telemetry.    Elevated troponin High-sensitivity troponin 26-> 20.  EKG without significant ischemic changes.  Thought secondary to demand in the setting of respiratory distress.    Renal insufficiency Creatinine 1.15 with BUN 19.   COPD, without acute exacerbation Patient without acute wheezing on physical exam. -Pharmacy substitution Trelegy Ellipta -Albuterol nebs as needed for shortness of breath/wheezing   Peripheral vascular disease Patient with prior history of right leg claudication requiring right femoral-popliteal bypass grafting in 2011. -Continue statin   History of diabetes mellitus  Patient is not on any medication for treatment.  Initial glucose 128. -Continue to monitor and consider placing on sliding scale insulin if blood sugars rise greater than 180   Hyperlipidemia -Continue Crestor   Lymphadenopathy Acute.  CT noted  enlarged right costophrenic angle lymph nodes for which showed question of possible metastatic disease.   Tobacco abuse Patient  still reports smoking quarter pack cigarettes per day on average. -Nicotine patch offered     DVT prophylaxis: Lovenox Code Status: Full Family Communication: None at bedside  Status is: Inpatient    Consultants:  PCCM Cardiology   Subjective:  Denies any chest pain, report dyspnea has improved  Objective: Vitals:   06/15/22 0431 06/15/22 0808 06/15/22 0820 06/15/22 1042  BP: 128/66  127/71 (!) 148/54  Pulse: 94 93 94 94  Resp: 19 (!) 23 17 17   Temp: 97.8 F (36.6 C)  99 F (37.2 C)   TempSrc: Oral  Oral   SpO2: 94% 95% 94% 94%  Weight:      Height:        Intake/Output Summary (Last 24 hours) at 06/15/2022 1339 Last data filed at 06/15/2022 8413 Gross per 24 hour  Intake 360 ml  Output 600 ml  Net -240 ml   Filed Weights   06/11/22 1244 06/14/22 0633  Weight: 64 kg 65.9 kg    Examination:  Awake Alert, Oriented X 3, No new F.N deficits, Normal affect Symmetrical Chest wall movement, proved air entry in the right lung, it remains with some Rales RRR,No Gallops,Rubs or new Murmurs, No Parasternal Heave +ve B.Sounds, Abd Soft, No tenderness, No rebound - guarding or rigidity. No Cyanosis, Clubbing or edema, No new Rash or bruise      Data Reviewed: I have personally reviewed following labs and imaging studies  CBC: Recent Labs  Lab 06/11/22 1301 06/12/22 0345 06/13/22 0242 06/14/22 0243 06/15/22 0542  WBC 12.0* 12.9* 13.0* 13.7* 12.8*  NEUTROABS 9.5*  --   --   --   --   HGB 14.9 14.1 13.7 13.3 13.1  HCT 43.1 41.0 38.8 38.6 37.4  MCV 83.5 82.7 81.7 82.3 81.1  PLT 305 271 237 264 244    Basic Metabolic Panel: Recent Labs  Lab 06/11/22 1301 06/12/22 0345 06/13/22 0242 06/14/22 0243 06/14/22 0307 06/15/22 0542  NA 137 136 135 135  --  136  K 4.1 4.1 3.8 3.7  --  4.6  CL 100 102 104 103  --  103  CO2 26 24 22 22   --  22  GLUCOSE 128* 152* 147* 133*  --  129*  BUN 19 20 19 18   --  22  CREATININE 1.15* 1.18* 1.03* 1.09*  --  0.97   CALCIUM 9.4 9.1 8.7* 8.9  --  9.1  MG  --   --   --   --  2.0 2.5*    GFR: Estimated Creatinine Clearance: 40.1 mL/min (by C-G formula based on SCr of 0.97 mg/dL).  Liver Function Tests: Recent Labs  Lab 06/11/22 1301 06/12/22 0345  AST 29  --   ALT 22  --   ALKPHOS 69  --   BILITOT 0.2*  --   PROT 6.7 6.1*  ALBUMIN 2.7*  --     CBG: No results for input(s): "GLUCAP" in the last 168 hours.   Recent Results (from the past 240 hour(s))  Urine Culture     Status: None   Collection Time: 06/11/22 12:00 PM   Specimen: In/Out Cath Urine  Result Value Ref Range Status   Specimen Description IN/OUT CATH URINE  Final   Special Requests NONE  Final   Culture   Final    NO GROWTH Performed at [facility],  [address], [place], [place] [address]    Report Status 06/13/2022 FINAL  Final  Culture, blood (Routine x 2)     Status: None (Preliminary result)   Collection Time: 06/11/22 12:53 PM   Specimen: BLOOD  Result Value Ref Range Status   Specimen Description BLOOD RIGHT ANTECUBITAL  Final   Special Requests   Final    BOTTLES DRAWN AEROBIC AND ANAEROBIC Blood Culture adequate volume   Culture   Final    NO GROWTH 4 DAYS Performed at [facility], [place] [address], [place], [place] [address]    Report Status PENDING  Incomplete  Resp Panel by RT-PCR (Flu A&B, Covid) Anterior Nasal Swab     Status: None   Collection Time: 06/11/22  1:21 PM   Specimen: Anterior Nasal Swab  Result Value Ref Range Status   SARS Coronavirus 2 by RT PCR NEGATIVE NEGATIVE Final    Comment: (NOTE) SARS-CoV-2 target nucleic acids are NOT DETECTED.  The SARS-CoV-2 RNA is generally detectable in upper respiratory specimens during the acute phase of infection. The lowest concentration of SARS-CoV-2 viral copies this assay can detect is 138 copies/mL. A negative result does not preclude SARS-Cov-2 infection and should not be used as the sole basis for treatment or other patient  management decisions. A negative result may occur with  improper specimen collection/handling, submission of specimen other than nasopharyngeal swab, presence of viral mutation(s) within the areas targeted by this assay, and inadequate number of viral copies(<138 copies/mL). A negative result must be combined with clinical observations, patient history, and epidemiological information. The expected result is Negative.  Fact Sheet for Patients:  EntrepreneurPulse.com.au  Fact Sheet for Healthcare Providers:  IncredibleEmployment.be  This test is no t yet approved or cleared by the [place] FDA and  has been authorized for detection and/or diagnosis of SARS-CoV-2 by FDA under an Emergency Use Authorization (EUA). This EUA will remain  in effect (meaning this test can be used) for the duration of the COVID-19 declaration under Section 564(b)(1) of the Act, 21 U.S.C.section 360bbb-3(b)(1), unless the authorization is terminated  or revoked sooner.       Influenza A by PCR NEGATIVE NEGATIVE Final   Influenza B by PCR NEGATIVE NEGATIVE Final    Comment: (NOTE) The Xpert Xpress SARS-CoV-2/FLU/RSV plus assay is intended as an aid in the diagnosis of influenza from Nasopharyngeal swab specimens and should not be used as a sole basis for treatment. Nasal washings and aspirates are unacceptable for Xpert Xpress SARS-CoV-2/FLU/RSV testing.  Fact Sheet for Patients: EntrepreneurPulse.com.au  Fact Sheet for Healthcare Providers: IncredibleEmployment.be  This test is not yet approved or cleared by the [place] FDA and has been authorized for detection and/or diagnosis of SARS-CoV-2 by FDA under an Emergency Use Authorization (EUA). This EUA will remain in effect (meaning this test can be used) for the duration of the COVID-19 declaration under Section 564(b)(1) of the Act, 21 U.S.C. section 360bbb-3(b)(1),  unless the authorization is terminated or revoked.  Performed at [facility], [place] [address], [place],  [address]   Body fluid culture w Gram Stain     Status: None   Collection Time: 06/11/22  5:18 PM   Specimen: Body Fluid  Result Value Ref Range Status   Specimen Description FLUID PLEURAL RIGHT  Final   Special Requests Normal  Final   Gram Stain   Final    RARE WBC PRESENT, PREDOMINANTLY PMN NO ORGANISMS SEEN  Culture   Final    NO GROWTH 3 DAYS Performed at [facility], [place] [address] [place], [place] [address]    Report Status 06/14/2022 FINAL  Final  Culture, blood (Routine x 2)     Status: None (Preliminary result)   Collection Time: 06/12/22  3:45 AM   Specimen: BLOOD  Result Value Ref Range Status   Specimen Description BLOOD SITE NOT SPECIFIED  Final   Special Requests   Final    BOTTLES DRAWN AEROBIC AND ANAEROBIC Blood Culture adequate volume   Culture   Final    NO GROWTH 3 DAYS Performed at [facility], [place] [address] [place], [place] [address]    Report Status PENDING  Incomplete  MRSA Next Gen by PCR, Nasal     Status: None   Collection Time: 06/12/22  7:32 PM   Specimen: Nasal Mucosa; Nasal Swab  Result Value Ref Range Status   MRSA by PCR Next Gen NOT DETECTED NOT DETECTED Final    Comment: (NOTE) The GeneXpert MRSA Assay (FDA approved for NASAL specimens only), is one component of a comprehensive MRSA colonization surveillance program. It is not intended to diagnose MRSA infection nor to guide or monitor treatment for MRSA infections. Test performance is not FDA approved in patients less than 72 years old. Performed at [facility], [place] [address] [place], [place] [address]          Radiology Studies: ECHOCARDIOGRAM COMPLETE  Result Date: 06/15/2022    ECHOCARDIOGRAM REPORT   Patient Name:   Allison [name] Date of Exam: 06/15/2022 Medical Rec #:  [id number]    Height:       63.0 in Accession #:     [id number]   Weight:       145.3 lb Date of Birth:  04/24/1941    BSA:          1.688 m Patient Age:    26 years     BP:           127/71 mmHg Patient Gender: F            HR:           76 bpm. Exam Location:  Inpatient Procedure: 2D Echo, Cardiac Doppler and Color Doppler Indications:    I47.2 Ventricular tachycardia  History:        Patient has no prior history of Echocardiogram examinations.                 CAD, Abnormal ECG, COPD, Arrythmias:NSVT,                 Signs/Symptoms:Shortness of Breath and Dyspnea; Risk                 Factors:Hypertension, Dyslipidemia, Current Smoker and Diabetes.  Sonographer:    Roseanna Rainbow RDCS Referring Phys: [id number] [facility]  Sonographer Comments: Suboptimal parasternal window. IMPRESSIONS  1. Left ventricular ejection fraction, by estimation, is >75%. The left ventricle has normal function. The left ventricle has no regional wall motion abnormalities. Left ventricular diastolic parameters are consistent with Grade I diastolic dysfunction (impaired relaxation).  2. Right ventricular systolic function is normal. The right ventricular size is normal.  3. The mitral valve is normal in structure. No evidence of mitral valve regurgitation. No evidence of mitral stenosis.  4. The aortic valve was not well visualized. There is mild calcification of the aortic valve. There is mild thickening of the  aortic valve. Aortic valve regurgitation is not visualized. No aortic stenosis is present.  5. The inferior vena cava is normal in size with greater than 50% respiratory variability, suggesting right atrial pressure of 3 mmHg. FINDINGS  Left Ventricle: Left ventricular ejection fraction, by estimation, is >75%. The left ventricle has normal function. The left ventricle has no regional wall motion abnormalities. The left ventricular internal cavity size was normal in size. There is no left ventricular hypertrophy. Left ventricular diastolic parameters are consistent with Grade I  diastolic dysfunction (impaired relaxation). Normal left ventricular filling pressure. Right Ventricle: The right ventricular size is normal. Right vetricular wall thickness was not well visualized. Right ventricular systolic function is normal. Left Atrium: Left atrial size was normal in size. Right Atrium: Right atrial size was normal in size. Pericardium: There is no evidence of pericardial effusion. Mitral Valve: The mitral valve is normal in structure. No evidence of mitral valve regurgitation. No evidence of mitral valve stenosis. MV peak gradient, 7.1 mmHg. The mean mitral valve gradient is 2.0 mmHg. Tricuspid Valve: The tricuspid valve is normal in structure. Tricuspid valve regurgitation is not demonstrated. No evidence of tricuspid stenosis. Aortic Valve: The aortic valve was not well visualized. There is mild calcification of the aortic valve. There is mild thickening of the aortic valve. There is mild aortic valve annular calcification. Aortic valve regurgitation is not visualized. No aortic stenosis is present. Aortic valve mean gradient measures 5.9 mmHg. Aortic valve peak gradient measures 11.2 mmHg. Aortic valve area, by VTI measures 1.89 cm. Pulmonic Valve: The pulmonic valve was not well visualized. Pulmonic valve regurgitation is not visualized. No evidence of pulmonic stenosis. Aorta: The aortic root is normal in size and structure. Venous: The inferior vena cava is normal in size with greater than 50% respiratory variability, suggesting right atrial pressure of 3 mmHg. IAS/Shunts: The interatrial septum is aneurysmal. No atrial level shunt detected by color flow Doppler.  LEFT VENTRICLE PLAX 2D LVIDd:         4.00 cm     Diastology LVIDs:         2.30 cm     LV e' medial:    8.27 cm/s LV PW:         0.80 cm     LV E/e' medial:  10.3 LV IVS:        1.00 cm     LV e' lateral:   5.87 cm/s LVOT diam:     1.80 cm     LV E/e' lateral: 14.5 LV SV:         67 LV SV Index:   40 LVOT Area:     2.54 cm   LV Volumes (MOD) LV vol d, MOD A2C: 57.6 ml LV vol d, MOD A4C: 45.9 ml LV vol s, MOD A2C: 12.8 ml LV vol s, MOD A4C: 12.3 ml LV SV MOD A2C:     44.8 ml LV SV MOD A4C:     45.9 ml LV SV MOD BP:      38.2 ml RIGHT VENTRICLE             IVC RV S prime:     14.80 cm/s  IVC diam: 0.80 cm TAPSE (M-mode): 2.2 cm LEFT ATRIUM             Index        RIGHT ATRIUM          Index LA diam:  2.80 cm 1.66 cm/m   RA Area:     9.79 cm LA Vol (A2C):   26.2 ml 15.52 ml/m  RA Volume:   20.60 ml 12.20 ml/m LA Vol (A4C):   28.7 ml 17.00 ml/m LA Biplane Vol: 28.7 ml 17.00 ml/m  AORTIC VALVE AV Area (Vmax):    1.98 cm AV Area (Vmean):   2.01 cm AV Area (VTI):     1.89 cm AV Vmax:           167.48 cm/s AV Vmean:          111.456 cm/s AV VTI:            0.357 m AV Peak Grad:      11.2 mmHg AV Mean Grad:      5.9 mmHg LVOT Vmax:         130.00 cm/s LVOT Vmean:        88.000 cm/s LVOT VTI:          0.265 m LVOT/AV VTI ratio: 0.74  AORTA Ao Root diam: 2.90 cm MITRAL VALVE MV Area (PHT): 3.12 cm     SHUNTS MV Area VTI:   2.30 cm     Systemic VTI:  0.26 m MV Peak grad:  7.1 mmHg     Systemic Diam: 1.80 cm MV Mean grad:  2.0 mmHg MV Vmax:       1.33 m/s MV Vmean:      67.3 cm/s MV Decel Time: 243 msec MV E velocity: 84.90 cm/s MV A velocity: 123.00 cm/s MV E/A ratio:  0.69 Carlyle Dolly MD Electronically signed by Carlyle Dolly MD Signature Date/Time: 06/15/2022/1:38:17 PM    Final    DG CHEST PORT 1 VIEW  Result Date: 06/14/2022 CLINICAL DATA:  Reason for exam: Chest tube in place 2 for 2: Jocelyn EXAM: PORTABLE CHEST - 1 VIEW COMPARISON:  06/13/2022 and three views FINDINGS: Stable pigtail catheter laterally at the right lung base. Probable skin folds project over the right lung, no convincing pneumothorax. Coarse airspace opacities at the right lung base, partially improved since previous. Left lung clear. Heart size and mediastinal contours are within normal limits. Aortic Atherosclerosis (ICD10-170.0). No effusion.  Visualized bones unremarkable. IMPRESSION: 1. Stable right chest tube. No convincing pneumothorax. 2. Partial improvement in right lower lung airspace disease. Electronically Signed   By: Lucrezia Europe M.D.   On: 06/14/2022 09:20        Scheduled Meds:  [START ON 06/16/2022] amoxicillin-clavulanate  1 tablet Oral Q12H   aspirin EC  81 mg Oral Daily   enoxaparin (LOVENOX) injection  40 mg Subcutaneous Q24H   feeding supplement (GLUCERNA SHAKE)  237 mL Oral TID BM   fluticasone furoate-vilanterol  1 puff Inhalation Daily   And   umeclidinium bromide  1 puff Inhalation Daily   guaiFENesin  600 mg Oral BID   metoprolol tartrate  12.5 mg Oral BID   multivitamin with minerals  1 tablet Oral Daily   nicotine  7 mg Transdermal Daily   rosuvastatin  40 mg Oral Daily   sodium chloride flush  3 mL Intravenous Q12H   Continuous Infusions:  ceFEPime (MAXIPIME) IV 2 g (06/15/22 1047)     LOS: 4 days       Phillips Climes, MD [facility]   To contact the attending provider between 7A-7P or the covering provider during after hours 7P-7A, please log into the web site [email or web address] and access using universal [place] password for that web  site. If you do not have the password, please call the hospital operator.  06/15/2022, 1:39 PM

## 2022-06-16 ENCOUNTER — Telehealth: Payer: Self-pay | Admitting: Acute Care

## 2022-06-16 ENCOUNTER — Other Ambulatory Visit (HOSPITAL_COMMUNITY): Payer: Self-pay

## 2022-06-16 DIAGNOSIS — I4729 Other ventricular tachycardia: Secondary | ICD-10-CM | POA: Diagnosis not present

## 2022-06-16 DIAGNOSIS — Z720 Tobacco use: Secondary | ICD-10-CM | POA: Diagnosis not present

## 2022-06-16 DIAGNOSIS — J449 Chronic obstructive pulmonary disease, unspecified: Secondary | ICD-10-CM | POA: Diagnosis not present

## 2022-06-16 DIAGNOSIS — J90 Pleural effusion, not elsewhere classified: Secondary | ICD-10-CM | POA: Diagnosis not present

## 2022-06-16 DIAGNOSIS — J189 Pneumonia, unspecified organism: Secondary | ICD-10-CM | POA: Diagnosis not present

## 2022-06-16 DIAGNOSIS — I2510 Atherosclerotic heart disease of native coronary artery without angina pectoris: Secondary | ICD-10-CM | POA: Diagnosis not present

## 2022-06-16 LAB — MAGNESIUM: Magnesium: 2.3 mg/dL (ref 1.7–2.4)

## 2022-06-16 LAB — BASIC METABOLIC PANEL
Anion gap: 11 (ref 5–15)
BUN: 22 mg/dL (ref 8–23)
CO2: 22 mmol/L (ref 22–32)
Calcium: 9.1 mg/dL (ref 8.9–10.3)
Chloride: 105 mmol/L (ref 98–111)
Creatinine, Ser: 1.01 mg/dL — ABNORMAL HIGH (ref 0.44–1.00)
GFR, Estimated: 55 mL/min — ABNORMAL LOW (ref >60)
Glucose, Bld: 98 mg/dL (ref 70–99)
Potassium: 4.1 mmol/L (ref 3.5–5.1)
Sodium: 138 mmol/L (ref 135–145)

## 2022-06-16 LAB — CULTURE, BLOOD (ROUTINE X 2)
Culture: NO GROWTH
Special Requests: ADEQUATE

## 2022-06-16 LAB — CBC
HCT: 36.8 % (ref 36.0–46.0)
Hemoglobin: 12.8 g/dL (ref 12.0–15.0)
MCH: 28.4 pg (ref 26.0–34.0)
MCHC: 34.8 g/dL (ref 30.0–36.0)
MCV: 81.6 fL (ref 80.0–100.0)
Platelets: 277 10*3/uL (ref 150–400)
RBC: 4.51 MIL/uL (ref 3.87–5.11)
RDW: 13.2 % (ref 11.5–15.5)
WBC: 11.6 10*3/uL — ABNORMAL HIGH (ref 4.0–10.5)
nRBC: 0 % (ref 0.0–0.2)

## 2022-06-16 MED ORDER — NICOTINE 7 MG/24HR TD PT24: 7.0000 mg | MEDICATED_PATCH | Freq: Every day | TRANSDERMAL | 0 refills | Status: AC

## 2022-06-16 MED ORDER — ASPIRIN 81 MG PO TBEC
81.0000 mg | DELAYED_RELEASE_TABLET | Freq: Every day | ORAL | 1 refills | Status: AC
  Filled 2022-06-16: qty 30, 30d supply, fill #0

## 2022-06-16 MED ORDER — GUAIFENESIN ER 600 MG PO TB12
600.0000 mg | ORAL_TABLET | Freq: Two times a day (BID) | ORAL | 0 refills | Status: AC
  Filled 2022-06-16: qty 14, 7d supply, fill #0

## 2022-06-16 MED ORDER — METOPROLOL TARTRATE 25 MG PO TABS
12.5000 mg | ORAL_TABLET | Freq: Two times a day (BID) | ORAL | 1 refills | Status: AC
  Filled 2022-06-16: qty 30, 30d supply, fill #0

## 2022-06-16 MED ORDER — ACETAMINOPHEN 325 MG PO TABS: 650.0000 mg | ORAL_TABLET | Freq: Four times a day (QID) | ORAL | Status: DC | PRN

## 2022-06-16 MED ORDER — AMOXICILLIN-POT CLAVULANATE 875-125 MG PO TABS
1.0000 | ORAL_TABLET | Freq: Two times a day (BID) | ORAL | 0 refills | Status: DC
  Filled 2022-06-16: qty 12, 6d supply, fill #0

## 2022-06-16 NOTE — Discharge Instructions (Signed)
Follow with Primary MD Seward Carol, MD in 7 days   Get CBC, CMP, 2 view Chest X ray checked  by Primary MD next visit.    Activity: As tolerated with Full fall precautions use walker/cane & assistance as needed   Disposition Home    Diet: Heart Healthy  , with feeding assistance and aspiration precautions.   On your next visit with your primary care physician please Get Medicines reviewed and adjusted.   Please request your Prim.MD to go over all Hospital Tests and Procedure/Radiological results at the follow up, please get all Hospital records sent to your Prim MD by signing hospital release before you go home.   If you experience worsening of your admission symptoms, develop shortness of breath, life threatening emergency, suicidal or homicidal thoughts you must seek medical attention immediately by calling 911 or calling your MD immediately  if symptoms less severe.  You Must read complete instructions/literature along with all the possible adverse reactions/side effects for all the Medicines you take and that have been prescribed to you. Take any new Medicines after you have completely understood and accpet all the possible adverse reactions/side effects.   Do not drive, operating heavy machinery, perform activities at heights, swimming or participation in water activities or provide baby sitting services if your were admitted for syncope or siezures until you have seen by Primary MD or a Neurologist and advised to do so again.  Do not drive when taking Pain medications.    Do not take more than prescribed Pain, Sleep and Anxiety Medications  Special Instructions: If you have smoked or chewed Tobacco  in the last 2 yrs please stop smoking, stop any regular Alcohol  and or any Recreational drug use.  Wear Seat belts while driving.   Please note  You were cared for by a hospitalist during your hospital stay. If you have any questions about your discharge medications or the  care you received while you were in the hospital after you are discharged, you can call the unit and asked to speak with the hospitalist on call if the hospitalist that took care of you is not available. Once you are discharged, your primary care physician will handle any further medical issues. Please note that NO REFILLS for any discharge medications will be authorized once you are discharged, as it is imperative that you return to your primary care physician (or establish a relationship with a primary care physician if you do not have one) for your aftercare needs so that they can reassess your need for medications and monitor your lab values.

## 2022-06-16 NOTE — Care Management Important Message (Signed)
Important Message  Patient Details  Name: LEMYA GREENWELL MRN: [id number] Date of Birth: [birth year]   Medicare Important Message Given:  Yes     Shelda Altes 06/16/2022, 11:00 AM

## 2022-06-16 NOTE — Discharge Summary (Signed)
Physician Discharge Summary  Allison [name] OVF:[id number] DOB: 1941-01-02 DOA: 06/11/2022  PCP: Seward Carol, MD  Admit date: 06/11/2022 Discharge date: 06/16/2022  Admitted From: (Home) Disposition:  (Home)  Recommendations for Outpatient Follow-up:  Follow up with PCP in 1 weeks Please obtain BMP/CBC in one week Please repeat 2 view chest x-ray during next visit Patient to follow with cardiology as an outpatient, [facility] to arrange for follow-up. Ambulatory referral has been made to oncology, as well discussed with Dr. Benay Spice who will arrange for outpatient follow-up at [facility] regarding cytology finding on her pleural effusion analysis which is suspicious for malignancy, atypical large cells suspicious for tumor with numerous background chronic inflammatory cells.  I have discussed these findings in details with the patient and her husband at bedside, and they do understand the importance of the follow-up.  Home Health: was ordered by case management but she declined.  Discharge Condition: (Stable) CODE STATUS: (FULL) Diet recommendation: Heart Healthy  Brief/Interim Summary:  Allison [name] is a 81 y.o. female with medical history significant of hypertension, hyperlipidemia, CAD, COPD, DM type II, PVD, and tobacco abuse who presents with complaints of shortness of breath and cough.  She has been coughing up thick mucus with associated chest discomfort.  Denies having any significant fevers.  She had been seen in the emergency department on 10/10  diagnosed with concern for community-acquired pneumonia where chest x-ray revealed moderate right-sided pleural effusion with right lower lobe actelectaisis at that time. Discharged home with 7-day course of Levaquin which she states she completed yesterday.  Despite doing this patient reported generalized malaise, weakness, continuous productive cough and shortness of breath even at rest.  Over the last 2 days she has not eaten  much of anything and reports needing assistance to stand. Normal she was able to stand and ambulate without assistance.    Upon admission into the emergency department patient was noted to be afebrile with heart rates 93-101, respirations 25-30, and O2 saturations maintained on 2 L nasal cannula oxygen.  Labs noted WBC 12, BNP 76.8, and high-sensitivity troponin 26.  Chest x-ray revealed enlarging right-sided pleural effusion.  Patient has been started on empiric antibiotics of vancomycin and cefepime      Acute respiratory distress secondary to pleural effusion Pleural effusion, exudative, parapneumonic -Presents  with dyspnea, hypoxia, requiring oxygen.  Work-up significant for right pleural effusion, patient was seen by PCCM, chest tube was inserted 10/18, lytics has been applied 10/19, chest tube was discontinued 06/14/2022, chest x-ray has been stable, work-up significant for exudative pleural effusion, most likely parapneumonic, ultras and Gram stains has been negative , she was treated with IV antibiotics cefepime and vancomycin initially, then cefepime alone, she will be transitioned to [facility] on discharge to finish total of 10 days treatment . -Just prior to discharge cytology report has been posted, which indicates pleural effusion cytology analysis which is suspicious for malignancy, atypical large cells suspicious for tumor with numerous background chronic inflammatory cells.  I have discussed these findings in details with the patient and her husband at bedside, and they do understand the importance of the follow-up.  Dr. Benay Spice  to arrange for outpatient follow-up with with the lung cancer center    NSVT -Patient had nonsustained V. tach, she is asymptomatic, started on metoprolol 25 mg oral p.o. twice daily, 2D echo with EF > 84%, grade 1 diastolic dysfunction, she was started on low-dose metoprolol .    Elevated troponin High-sensitivity troponin  26-> 20.  EKG without significant  ischemic changes.  Thought secondary to demand in the setting of respiratory distress.    Lymphadenopathy Acute.  CT noted enlarged right costophrenic angle lymph nodes for which showed question of possible metastatic disease. -Please see discussion above regarding outpatient follow-up with oncology.  Renal insufficiency Creatinine 1.15 with BUN 19.   COPD, without acute exacerbation Patient without acute wheezing on physical exam. -Pharmacy substitution Trelegy Ellipta -Albuterol nebs as needed for shortness of breath/wheezing   Peripheral vascular disease Patient with prior history of right leg claudication requiring right femoral-popliteal bypass grafting in 2011. -Continue statin   History of diabetes mellitus  Patient is not on any medication for treatment.  Initial glucose 128. -Significant hyperglycemia during hospital stay while she was on insulin sliding scale   Hyperlipidemia -Continue Crestor   Tobacco abuse Patient still reports smoking quarter pack cigarettes per day on average.  She was counseled, patient reports she is determined to quit smoking     Discharge Diagnoses:  Principal Problem:   Pleural effusion Active Problems:   Respiratory distress   SIRS (systemic inflammatory response syndrome) (HCC)   Elevated troponin   Renal insufficiency   COPD (chronic obstructive pulmonary disease) (HCC)   Peripheral vascular disease, unspecified (HCC)   History of diabetes mellitus   HYPERCHOLESTEROLEMIA   Tobacco abuse   Chest tube in place   Parapneumonic effusion   Malnutrition of moderate degree   NSVT (nonsustained ventricular tachycardia) (HCC)   Coronary artery disease involving native coronary artery of native heart    Discharge Instructions  Discharge Instructions     Diet - low sodium heart healthy   Complete by: As directed    Discharge instructions   Complete by: As directed    Follow with Primary MD Seward Carol, MD in 7 days   Get  CBC, CMP, 2 view Chest X ray checked  by Primary MD next visit.    Activity: As tolerated with Full fall precautions use walker/cane & assistance as needed   Disposition Home    Diet: Heart Healthy  , with feeding assistance and aspiration precautions.   On your next visit with your primary care physician please Get Medicines reviewed and adjusted.   Please request your Prim.MD to go over all Hospital Tests and Procedure/Radiological results at the follow up, please get all Hospital records sent to your Prim MD by signing hospital release before you go home.   If you experience worsening of your admission symptoms, develop shortness of breath, life threatening emergency, suicidal or homicidal thoughts you must seek medical attention immediately by calling 911 or calling your MD immediately  if symptoms less severe.  You Must read complete instructions/literature along with all the possible adverse reactions/side effects for all the Medicines you take and that have been prescribed to you. Take any new Medicines after you have completely understood and accpet all the possible adverse reactions/side effects.   Do not drive, operating heavy machinery, perform activities at heights, swimming or participation in water activities or provide baby sitting services if your were admitted for syncope or siezures until you have seen by Primary MD or a Neurologist and advised to do so again.  Do not drive when taking Pain medications.    Do not take more than prescribed Pain, Sleep and Anxiety Medications  Special Instructions: If you have smoked or chewed Tobacco  in the last 2 yrs please stop smoking, stop any regular Alcohol  and or  any Recreational drug use.  Wear Seat belts while driving.   Please note  You were cared for by a hospitalist during your hospital stay. If you have any questions about your discharge medications or the care you received while you were in the hospital after you  are discharged, you can call the unit and asked to speak with the hospitalist on call if the hospitalist that took care of you is not available. Once you are discharged, your primary care physician will handle any further medical issues. Please note that NO REFILLS for any discharge medications will be authorized once you are discharged, as it is imperative that you return to your primary care physician (or establish a relationship with a primary care physician if you do not have one) for your aftercare needs so that they can reassess your need for medications and monitor your lab values.   Increase activity slowly   Complete by: As directed       Allergies as of 06/16/2022       Reactions   Cefaclor Other (See Comments)   UNK reaction, patient says she has never had allergic rxn to antibiotic    Minocycline Hcl Other (See Comments)   Unk reaction   Nitrofurantoin Other (See Comments)   UNK reaction   Sucralfate Other (See Comments)   Unk reaction        Medication List     STOP taking these medications    hydrochlorothiazide 25 MG tablet Commonly known as: HYDRODIURIL   levofloxacin 750 MG tablet Commonly known as: Levaquin   multivitamin tablet   naproxen sodium 220 MG tablet Commonly known as: ALEVE       TAKE these medications    acetaminophen 325 MG tablet Commonly known as: TYLENOL Take 2 tablets (650 mg total) by mouth every 6 (six) hours as needed for mild pain (or Fever >/= 101).   amoxicillin-clavulanate 875-125 MG tablet Commonly known as: AUGMENTIN Take 1 tablet by mouth every 12 (twelve) hours.   aspirin EC 81 MG tablet Take 1 tablet (81 mg total) by mouth daily. Swallow whole. Start taking on: June 17, 2022   guaiFENesin 600 MG 12 hr tablet Commonly known as: MUCINEX Take 1 tablet (600 mg total) by mouth 2 (two) times daily for 7 days.   lidocaine 5 % Commonly known as: Lidoderm Place 1 patch onto the skin daily. Remove & Discard patch  within 12 hours or as directed by MD   metoprolol tartrate 25 MG tablet Commonly known as: LOPRESSOR Take 0.5 tablets (12.5 mg total) by mouth 2 (two) times daily.   nicotine 7 mg/24hr patch Commonly known as: NICODERM CQ - dosed in mg/24 hr Place 1 patch (7 mg total) onto the skin daily. Start taking on: June 17, 2022   Rosuvastatin Calcium 40 MG Cpsp Take 40 mg by mouth daily.   Trelegy Ellipta 100-62.5-25 MCG/ACT Aepb Generic drug: Fluticasone-Umeclidin-Vilant Take 1 puff by mouth daily.        Follow-up Information     Seward Carol, MD Follow up in 1 week(s).   Specialty: Internal Medicine Contact information: [address] [place] [address] [phone]                Allergies  Allergen Reactions   Cefaclor Other (See Comments)    UNK reaction, patient says she has never had allergic rxn to antibiotic    Minocycline Hcl Other (See Comments)    Unk reaction  Nitrofurantoin Other (See Comments)    UNK reaction   Sucralfate Other (See Comments)    Unk reaction    Consultations: PCCM   Procedures/Studies: ECHOCARDIOGRAM COMPLETE  Result Date: 06/15/2022    ECHOCARDIOGRAM REPORT   Patient Name:   Shylyn MYANGEL SUMMONS Date of Exam: 06/15/2022 Medical Rec #:  [id number]    Height:       63.0 in Accession #:    [id number]   Weight:       145.3 lb Date of Birth:  1941/08/11    BSA:          1.688 m Patient Age:    44 years     BP:           127/71 mmHg Patient Gender: F            HR:           76 bpm. Exam Location:  Inpatient Procedure: 2D Echo, Cardiac Doppler and Color Doppler Indications:    I47.2 Ventricular tachycardia  History:        Patient has no prior history of Echocardiogram examinations.                 CAD, Abnormal ECG, COPD, Arrythmias:NSVT,                 Signs/Symptoms:Shortness of Breath and Dyspnea; Risk                 Factors:Hypertension, Dyslipidemia, Current Smoker and Diabetes.  Sonographer:    Roseanna Rainbow RDCS  Referring Phys: [id number] Laylonie Marzec S [facility]  Sonographer Comments: Suboptimal parasternal window. IMPRESSIONS  1. Left ventricular ejection fraction, by estimation, is >75%. The left ventricle has normal function. The left ventricle has no regional wall motion abnormalities. Left ventricular diastolic parameters are consistent with Grade I diastolic dysfunction (impaired relaxation).  2. Right ventricular systolic function is normal. The right ventricular size is normal.  3. The mitral valve is normal in structure. No evidence of mitral valve regurgitation. No evidence of mitral stenosis.  4. The aortic valve was not well visualized. There is mild calcification of the aortic valve. There is mild thickening of the aortic valve. Aortic valve regurgitation is not visualized. No aortic stenosis is present.  5. The inferior vena cava is normal in size with greater than 50% respiratory variability, suggesting right atrial pressure of 3 mmHg. FINDINGS  Left Ventricle: Left ventricular ejection fraction, by estimation, is >75%. The left ventricle has normal function. The left ventricle has no regional wall motion abnormalities. The left ventricular internal cavity size was normal in size. There is no left ventricular hypertrophy. Left ventricular diastolic parameters are consistent with Grade I diastolic dysfunction (impaired relaxation). Normal left ventricular filling pressure. Right Ventricle: The right ventricular size is normal. Right vetricular wall thickness was not well visualized. Right ventricular systolic function is normal. Left Atrium: Left atrial size was normal in size. Right Atrium: Right atrial size was normal in size. Pericardium: There is no evidence of pericardial effusion. Mitral Valve: The mitral valve is normal in structure. No evidence of mitral valve regurgitation. No evidence of mitral valve stenosis. MV peak gradient, 7.1 mmHg. The mean mitral valve gradient is 2.0 mmHg. Tricuspid Valve: The tricuspid  valve is normal in structure. Tricuspid valve regurgitation is not demonstrated. No evidence of tricuspid stenosis. Aortic Valve: The aortic valve was not well visualized. There is mild calcification of the aortic valve. There is mild thickening of the aortic valve.  There is mild aortic valve annular calcification. Aortic valve regurgitation is not visualized. No aortic stenosis is present. Aortic valve mean gradient measures 5.9 mmHg. Aortic valve peak gradient measures 11.2 mmHg. Aortic valve area, by VTI measures 1.89 cm. Pulmonic Valve: The pulmonic valve was not well visualized. Pulmonic valve regurgitation is not visualized. No evidence of pulmonic stenosis. Aorta: The aortic root is normal in size and structure. Venous: The inferior vena cava is normal in size with greater than 50% respiratory variability, suggesting right atrial pressure of 3 mmHg. IAS/Shunts: The interatrial septum is aneurysmal. No atrial level shunt detected by color flow Doppler.  LEFT VENTRICLE PLAX 2D LVIDd:         4.00 cm     Diastology LVIDs:         2.30 cm     LV e' medial:    8.27 cm/s LV PW:         0.80 cm     LV E/e' medial:  10.3 LV IVS:        1.00 cm     LV e' lateral:   5.87 cm/s LVOT diam:     1.80 cm     LV E/e' lateral: 14.5 LV SV:         67 LV SV Index:   40 LVOT Area:     2.54 cm  LV Volumes (MOD) LV vol d, MOD A2C: 57.6 ml LV vol d, MOD A4C: 45.9 ml LV vol s, MOD A2C: 12.8 ml LV vol s, MOD A4C: 12.3 ml LV SV MOD A2C:     44.8 ml LV SV MOD A4C:     45.9 ml LV SV MOD BP:      38.2 ml RIGHT VENTRICLE             IVC RV S prime:     14.80 cm/s  IVC diam: 0.80 cm TAPSE (M-mode): 2.2 cm LEFT ATRIUM             Index        RIGHT ATRIUM          Index LA diam:        2.80 cm 1.66 cm/m   RA Area:     9.79 cm LA Vol (A2C):   26.2 ml 15.52 ml/m  RA Volume:   20.60 ml 12.20 ml/m LA Vol (A4C):   28.7 ml 17.00 ml/m LA Biplane Vol: 28.7 ml 17.00 ml/m  AORTIC VALVE AV Area (Vmax):    1.98 cm AV Area (Vmean):   2.01 cm AV  Area (VTI):     1.89 cm AV Vmax:           167.48 cm/s AV Vmean:          111.456 cm/s AV VTI:            0.357 m AV Peak Grad:      11.2 mmHg AV Mean Grad:      5.9 mmHg LVOT Vmax:         130.00 cm/s LVOT Vmean:        88.000 cm/s LVOT VTI:          0.265 m LVOT/AV VTI ratio: 0.74  AORTA Ao Root diam: 2.90 cm MITRAL VALVE MV Area (PHT): 3.12 cm     SHUNTS MV Area VTI:   2.30 cm     Systemic VTI:  0.26 m MV Peak grad:  7.1 mmHg     Systemic Diam: 1.80  cm MV Mean grad:  2.0 mmHg MV Vmax:       1.33 m/s MV Vmean:      67.3 cm/s MV Decel Time: 243 msec MV E velocity: 84.90 cm/s MV A velocity: 123.00 cm/s MV E/A ratio:  0.69 Carlyle Dolly MD Electronically signed by Carlyle Dolly MD Signature Date/Time: 06/15/2022/1:38:17 PM    Final    DG CHEST PORT 1 VIEW  Result Date: 06/14/2022 CLINICAL DATA:  Reason for exam: Chest tube in place 2 for 2: Jocelyn EXAM: PORTABLE CHEST - 1 VIEW COMPARISON:  06/13/2022 and three views FINDINGS: Stable pigtail catheter laterally at the right lung base. Probable skin folds project over the right lung, no convincing pneumothorax. Coarse airspace opacities at the right lung base, partially improved since previous. Left lung clear. Heart size and mediastinal contours are within normal limits. Aortic Atherosclerosis (ICD10-170.0). No effusion. Visualized bones unremarkable. IMPRESSION: 1. Stable right chest tube. No convincing pneumothorax. 2. Partial improvement in right lower lung airspace disease. Electronically Signed   By: Lucrezia Europe M.D.   On: 06/14/2022 09:20   DG CHEST PORT 1 VIEW  Result Date: 06/13/2022 CLINICAL DATA:  Pleural effusion.  Chest tube. EXAM: PORTABLE CHEST 1 VIEW COMPARISON:  AP chest 06/12/2022, chest two views 06/11/2022, CT chest 06/11/2022 FINDINGS: Cardiac silhouette and mediastinal contours are within normal limits. Moderate calcification within the aortic arch. Unchanged right pigtail drainage catheter position. The pigtail appears possibly  minimally more open compared to prior however this may be due to projection. This may be due to minimal pullback tension on the catheter. No pneumothorax is seen. Mildly improved aeration of the right mid to lower lung, with predominantly horizontal linear density and more mild heterogeneous airspace opacity compared to prior. Likely tiny right pleural effusion, noting loculated right pleural fluid on recent 06/11/2022 CT prior to the chest tube placement. No acute skeletal abnormality. IMPRESSION: 1. Unchanged position of right pigtail drainage catheter. The pigtail appears minimally more open compared to prior. This may be due to projection or minimal pullback tension on the catheter. 2. Mildly improved aeration of the right mid to lower lung. Persistent linear and heterogeneous right basilar airspace opacities. 3. Likely tiny right pleural effusion, noting loculated fluid on recent 06/11/2022 CT. Electronically Signed   By: Yvonne Kendall M.D.   On: 06/13/2022 08:14   DG Chest Port 1 View  Result Date: 06/12/2022 CLINICAL DATA:  Chest tube.  Pleural effusion EXAM: PORTABLE CHEST 1 VIEW COMPARISON:  Yesterday FINDINGS: Right chest tube in place. No visible pneumothorax. Increased hazy opacity at the right base which could be pneumonia or superimposed pleural fluid. Interstitial coarsening in the remaining lungs is stable. Stable cardiomegaly and aortic tortuosity. IMPRESSION: Increased density at the right base which could be pleural fluid or pneumonia. Electronically Signed   By: Jorje Guild M.D.   On: 06/12/2022 04:43   DG Chest Port 1 View  Result Date: 06/11/2022 CLINICAL DATA:  Chest tube placement EXAM: PORTABLE CHEST 1 VIEW COMPARISON:  Same-day x-ray FINDINGS: Interval placement of right-sided chest tube with decreased volume of right-sided pleural effusion, now small volume. Improving aeration of the right lung base. Left lung is clear. Normal heart size. Aortic atherosclerosis. No  pneumothorax. IMPRESSION: Interval placement of right-sided chest tube with decreased volume of right-sided pleural effusion, now small volume. Improving aeration of the right lung base. Electronically Signed   By: Davina Poke D.O.   On: 06/11/2022 18:00   CT Chest Wo Contrast  Result Date: 06/11/2022 CLINICAL DATA:  Pleural effusion, malignancy suspected EXAM: CT CHEST WITHOUT CONTRAST TECHNIQUE: Multidetector CT imaging of the chest was performed following the standard protocol without IV contrast. RADIATION DOSE REDUCTION: This exam was performed according to the departmental dose-optimization program which includes automated exposure control, adjustment of the mA and/or kV according to patient size and/or use of iterative reconstruction technique. COMPARISON:  CT chest angio dated June 03, 2022 FINDINGS: Cardiovascular: Normal heart size. No pericardial effusion. Severe left main and three-vessel coronary artery calcifications. Normal caliber thoracic aorta with severe calcified plaque. Mediastinum/Nodes: Esophagus and thyroid are unremarkable. Enlarged lymph nodes of the right cardiophrenic angle. Reference node measuring 1.3 cm on series 4, image 98 and reference node measuring 1.1 cm on image 98. Lungs/Pleura: Central airways are patent. Mild centrilobular emphysema. Moderate to large loculated right pleural effusion with areas of subtle pleural nodular thickening, better appreciated on today's exam due to differences in technique. Nodular thickening of the fissures of the right lung. Linear opacity of the lingula, likely due to scarring or atelectasis. No consolidation or pneumothorax. Upper Abdomen: Bilateral simple appearing partially visualized renal lesions, likely simple cysts with no specific follow-up imaging recommended for these lesions. Low-density thickening of the bilateral adrenal glands, possibly due to adenomas, although metastatic disease is not entirely excluded.  Musculoskeletal: Lucent lucent lesion of the first left rib with associated nondisplaced fracture. IMPRESSION: 1. Moderate to large loculated right pleural effusion, increased in size when compared with prior exam. There is associated pleural nodularity and nodular fissural thickening, highly concerning for malignant pleural effusion. Recommend further evaluation with fluid sampling. 2. Enlarged right cardiophrenic angle lymph nodes, concerning for metastatic disease. 3. Low-density thickening of the bilateral adrenal glands possibly due to adenomas, although metastatic disease can not be excluded. Recommend attention on follow-up. 4. Lucent lesion of the first left rib with associated subacute appearing fracture, concerning for osseous metastatic disease and pathologic fracture. 5. Severe left main and three-vessel coronary artery calcifications. 6. Aortic Atherosclerosis (ICD10-I70.0) and Emphysema (ICD10-J43.9). Electronically Signed   By: Yetta Glassman M.D.   On: 06/11/2022 15:34   DG Chest 2 View  Result Date: 06/11/2022 CLINICAL DATA:  Shortness of breath and chest pain EXAM: CHEST - 2 VIEW COMPARISON:  06/10/2022 FINDINGS: Persistence and probable slight further enlargement of pleural effusion on the right. Compressive volume loss of the right lower lung. Left lung shows mild chronic markings but no evidence of infiltrate, collapse or effusion on that side. IMPRESSION: Probable slight further enlargement of the right pleural effusion. Compressive volume loss of the right lower lung. Electronically Signed   By: Nelson Chimes M.D.   On: 06/11/2022 13:43   DG Chest Right Decubitus  Result Date: 06/10/2022 CLINICAL DATA:  Pneumonia.  Short of breath EXAM: CHEST - RIGHT DECUBITUS COMPARISON:  Chest two-view 06/10/2022.  CT chest 06/03/2022 FINDINGS: Moderately large right pleural effusion which is layering. The effusion appears partially loculated on CT. There is consolidation in the right lung base.  Left lung clear. IMPRESSION: Moderately large right pleural effusion which layers. This may be partially loculated based on the recent CT. Electronically Signed   By: Franchot Gallo M.D.   On: 06/10/2022 17:33   DG Chest 2 View  Result Date: 06/10/2022 CLINICAL DATA:  Pneumonia. EXAM: CHEST - 2 VIEW COMPARISON:  CT chest 06/03/2022.  Chest two-view 06/02/2022 FINDINGS: Moderate right pleural effusion slightly larger. Right lower lobe and right middle lobe consolidation unchanged from the prior chest  x-ray. Left lung clear. Negative for heart failure. Atherosclerotic calcification aortic arch. IMPRESSION: Mild progression of right pleural effusion which is moderately large. Right lower lobe consolidation right middle lobe consolidation unchanged. Electronically Signed   By: Franchot Gallo M.D.   On: 06/10/2022 17:32   CT Angio Chest PE W and/or Wo Contrast  Result Date: 06/03/2022 CLINICAL DATA:  Pulmonary embolus suspected with high probability. Shortness of breath and chest pain for 5 days. EXAM: CT ANGIOGRAPHY CHEST WITH CONTRAST TECHNIQUE: Multidetector CT imaging of the chest was performed using the standard protocol during bolus administration of intravenous contrast. Multiplanar CT image reconstructions and MIPs were obtained to evaluate the vascular anatomy. RADIATION DOSE REDUCTION: This exam was performed according to the departmental dose-optimization program which includes automated exposure control, adjustment of the mA and/or kV according to patient size and/or use of iterative reconstruction technique. CONTRAST:  68mL OMNIPAQUE IOHEXOL 350 MG/ML SOLN COMPARISON:  None Available. FINDINGS: Cardiovascular: Good opacification of the central and segmental pulmonary arteries. No focal filling defects. No evidence of significant pulmonary embolus. Normal heart size. Small pericardial effusion. Normal caliber thoracic aorta. Calcification of the aorta and coronary arteries. Mediastinum/Nodes:  Thyroid gland is unremarkable. Esophagus is decompressed. No significant lymphadenopathy. Lungs/Pleura: Large right pleural effusion with possible loculations. Atelectasis or consolidation in the right lower lung with patchy infiltrates throughout the remainder of the right lung, likely pneumonia. Left lung is clear. Large pneumatocele in the left lingula. Upper Abdomen: No acute abnormalities demonstrated. Renal cysts measuring up to 5.3 cm diameter. No imaging follow-up is indicated. Musculoskeletal: Degenerative changes.  No destructive bone lesions. Review of the MIP images confirms the above findings. IMPRESSION: 1. No evidence of significant pulmonary embolus. 2. Large right pleural effusion, possibly loculated, with infiltration and consolidation in the right lung likely indicating pneumonia. Electronically Signed   By: Lucienne Capers M.D.   On: 06/03/2022 03:48   DG Chest 2 View  Result Date: 06/02/2022 CLINICAL DATA:  Shortness of breath EXAM: CHEST - 2 VIEW COMPARISON:  [id number] FINDINGS: Moderate right pleural effusion. Associated right lower lobe compressive atelectasis. Left lung is clear. No pneumothorax. The heart is normal in size.  Thoracic aortic atherosclerosis. IMPRESSION: Moderate right pleural effusion with right lower lobe atelectasis. Electronically Signed   By: Julian Hy M.D.   On: 06/02/2022 20:55      Subjective:  No significant events overnight, she denies any complaints, she is eager to go home today, I have gust at length with patient, and her husband about the cytology finding, and the importance to follow-up with her PCP and to follow-up with oncology appointment as an outpatient. Discharge Exam: Vitals:   06/16/22 0745 06/16/22 0828  BP:  100/60  Pulse: (!) 108 100  Resp: 18 20  Temp:  97.6 F (36.4 C)  SpO2: 93% 92%   Vitals:   06/15/22 2328 06/16/22 0325 06/16/22 0745 06/16/22 0828  BP: 138/62 (!) 122/58  100/60  Pulse: 88 84 (!) 108 100  Resp: (!)  25 (!) 21 18 20   Temp: 98.7 F (37.1 C) 98.1 F (36.7 C)  97.6 F (36.4 C)  TempSrc: Oral Oral  Oral  SpO2: 94% 97% 93% 92%  Weight:      Height:        General: Pt is alert, awake, not in acute distress Cardiovascular: RRR, S1/S2 +, no rubs, no gallops Respiratory: Diminished air entry in right lung with Rales Abdominal: Soft, NT, ND, bowel sounds + Extremities: no  edema, no cyanosis    The results of significant diagnostics from this hospitalization (including imaging, microbiology, ancillary and laboratory) are listed below for reference.     Microbiology: Recent Results (from the past 240 hour(s))  Urine Culture     Status: None   Collection Time: 06/11/22 12:00 PM   Specimen: In/Out Cath Urine  Result Value Ref Range Status   Specimen Description IN/OUT CATH URINE  Final   Special Requests NONE  Final   Culture   Final    NO GROWTH Performed at [facility], [address], [place], [place] [address]    Report Status 06/13/2022 FINAL  Final  Culture, blood (Routine x 2)     Status: None   Collection Time: 06/11/22 12:53 PM   Specimen: BLOOD  Result Value Ref Range Status   Specimen Description BLOOD RIGHT ANTECUBITAL  Final   Special Requests   Final    BOTTLES DRAWN AEROBIC AND ANAEROBIC Blood Culture adequate volume   Culture   Final    NO GROWTH 5 DAYS Performed at [facility], [place] [address] [place],  [address]    Report Status 06/16/2022 FINAL  Final  Resp Panel by RT-PCR (Flu A&B, Covid) Anterior Nasal Swab     Status: None   Collection Time: 06/11/22  1:21 PM   Specimen: Anterior Nasal Swab  Result Value Ref Range Status   SARS Coronavirus 2 by RT PCR NEGATIVE NEGATIVE Final    Comment: (NOTE) SARS-CoV-2 target nucleic acids are NOT DETECTED.  The SARS-CoV-2 RNA is generally detectable in upper respiratory specimens during the acute phase of infection. The lowest concentration of SARS-CoV-2 viral copies this assay can detect  is 138 copies/mL. A negative result does not preclude SARS-Cov-2 infection and should not be used as the sole basis for treatment or other patient management decisions. A negative result may occur with  improper specimen collection/handling, submission of specimen other than nasopharyngeal swab, presence of viral mutation(s) within the areas targeted by this assay, and inadequate number of viral copies(<138 copies/mL). A negative result must be combined with clinical observations, patient history, and epidemiological information. The expected result is Negative.  Fact Sheet for Patients:  EntrepreneurPulse.com.au  Fact Sheet for Healthcare Providers:  IncredibleEmployment.be  This test is no t yet approved or cleared by the [place] FDA and  has been authorized for detection and/or diagnosis of SARS-CoV-2 by FDA under an Emergency Use Authorization (EUA). This EUA will remain  in effect (meaning this test can be used) for the duration of the COVID-19 declaration under Section 564(b)(1) of the Act, 21 U.S.C.section 360bbb-3(b)(1), unless the authorization is terminated  or revoked sooner.       Influenza A by PCR NEGATIVE NEGATIVE Final   Influenza B by PCR NEGATIVE NEGATIVE Final    Comment: (NOTE) The Xpert Xpress SARS-CoV-2/FLU/RSV plus assay is intended as an aid in the diagnosis of influenza from Nasopharyngeal swab specimens and should not be used as a sole basis for treatment. Nasal washings and aspirates are unacceptable for Xpert Xpress SARS-CoV-2/FLU/RSV testing.  Fact Sheet for Patients: EntrepreneurPulse.com.au  Fact Sheet for Healthcare Providers: IncredibleEmployment.be  This test is not yet approved or cleared by the [place] FDA and has been authorized for detection and/or diagnosis of SARS-CoV-2 by FDA under an Emergency Use Authorization (EUA). This EUA will remain in effect  (meaning this test can be used) for the duration of the COVID-19 declaration under Section 564(b)(1)  of the Act, 21 U.S.C. section 360bbb-3(b)(1), unless the authorization is terminated or revoked.  Performed at [facility], [place] [address], [place], [place] [address]   Body fluid culture w Gram Stain     Status: None   Collection Time: 06/11/22  5:18 PM   Specimen: Body Fluid  Result Value Ref Range Status   Specimen Description FLUID PLEURAL RIGHT  Final   Special Requests Normal  Final   Gram Stain   Final    RARE WBC PRESENT, PREDOMINANTLY PMN NO ORGANISMS SEEN    Culture   Final    NO GROWTH 3 DAYS Performed at [place] [address], [place], [place] [address]    Report Status 06/14/2022 FINAL  Final  Culture, blood (Routine x 2)     Status: None (Preliminary result)   Collection Time: 06/12/22  3:45 AM   Specimen: BLOOD  Result Value Ref Range Status   Specimen Description BLOOD SITE NOT SPECIFIED  Final   Special Requests   Final    BOTTLES DRAWN AEROBIC AND ANAEROBIC Blood Culture adequate volume   Culture   Final    NO GROWTH 4 DAYS Performed at [facility], [place] [address], [place], [place] [address]    Report Status PENDING  Incomplete  MRSA Next Gen by PCR, Nasal     Status: None   Collection Time: 06/12/22  7:32 PM   Specimen: Nasal Mucosa; Nasal Swab  Result Value Ref Range Status   MRSA by PCR Next Gen NOT DETECTED NOT DETECTED Final    Comment: (NOTE) The GeneXpert MRSA Assay (FDA approved for NASAL specimens only), is one component of a comprehensive MRSA colonization surveillance program. It is not intended to diagnose MRSA infection nor to guide or monitor treatment for MRSA infections. Test performance is not FDA approved in patients less than 75 years old. Performed at [facility], [place] [address], [place], [place] [address]      Labs: BNP (last 3 results) Recent Labs    06/11/22 1253  BNP 76.1   Basic  Metabolic Panel: Recent Labs  Lab 06/12/22 0345 06/13/22 0242 06/14/22 0243 06/14/22 0307 06/15/22 0542 06/16/22 0205  NA 136 135 135  --  136 138  K 4.1 3.8 3.7  --  4.6 4.1  CL 102 104 103  --  103 105  CO2 24 22 22   --  22 22  GLUCOSE 152* 147* 133*  --  129* 98  BUN 20 19 18   --  22 22  CREATININE 1.18* 1.03* 1.09*  --  0.97 1.01*  CALCIUM 9.1 8.7* 8.9  --  9.1 9.1  MG  --   --   --  2.0 2.5* 2.3   Liver Function Tests: Recent Labs  Lab 06/11/22 1301 06/12/22 0345  AST 29  --   ALT 22  --   ALKPHOS 69  --   BILITOT 0.2*  --   PROT 6.7 6.1*  ALBUMIN 2.7*  --    No results for input(s): "LIPASE", "AMYLASE" in the last 168 hours. No results for input(s): "AMMONIA" in the last 168 hours. CBC: Recent Labs  Lab 06/11/22 1301 06/12/22 0345 06/13/22 0242 06/14/22 0243 06/15/22 0542 06/16/22 0205  WBC 12.0* 12.9* 13.0* 13.7* 12.8* 11.6*  NEUTROABS 9.5*  --   --   --   --   --   HGB 14.9 14.1 13.7 13.3 13.1 12.8  HCT 43.1  41.0 38.8 38.6 37.4 36.8  MCV 83.5 82.7 81.7 82.3 81.1 81.6  PLT 305 271 237 264 286 277   Cardiac Enzymes: No results for input(s): "CKTOTAL", "CKMB", "CKMBINDEX", "TROPONINI" in the last 168 hours. BNP: Invalid input(s): "POCBNP" CBG: No results for input(s): "GLUCAP" in the last 168 hours. D-Dimer No results for input(s): "DDIMER" in the last 72 hours. Hgb A1c No results for input(s): "HGBA1C" in the last 72 hours. Lipid Profile No results for input(s): "CHOL", "HDL", "LDLCALC", "TRIG", "CHOLHDL", "LDLDIRECT" in the last 72 hours. Thyroid function studies No results for input(s): "TSH", "T4TOTAL", "T3FREE", "THYROIDAB" in the last 72 hours.  Invalid input(s): "FREET3" Anemia work up No results for input(s): "VITAMINB12", "FOLATE", "FERRITIN", "TIBC", "IRON", "RETICCTPCT" in the last 72 hours. Urinalysis    Component Value Date/Time   COLORURINE YELLOW 06/12/2022 1200   APPEARANCEUR CLEAR 06/12/2022 1200   LABSPEC 1.025 06/12/2022  1200   PHURINE 5.0 06/12/2022 1200   GLUCOSEU NEGATIVE 06/12/2022 1200   HGBUR NEGATIVE 06/12/2022 1200   BILIRUBINUR NEGATIVE 06/12/2022 1200   KETONESUR NEGATIVE 06/12/2022 1200   PROTEINUR 100 (A) 06/12/2022 1200   UROBILINOGEN 0.2 04/22/2010 1010   NITRITE NEGATIVE 06/12/2022 1200   LEUKOCYTESUR NEGATIVE 06/12/2022 1200   Sepsis Labs Recent Labs  Lab 06/13/22 0242 06/14/22 0243 06/15/22 0542 06/16/22 0205  WBC 13.0* 13.7* 12.8* 11.6*   Microbiology Recent Results (from the past 240 hour(s))  Urine Culture     Status: None   Collection Time: 06/11/22 12:00 PM   Specimen: In/Out Cath Urine  Result Value Ref Range Status   Specimen Description IN/OUT CATH URINE  Final   Special Requests NONE  Final   Culture   Final    NO GROWTH Performed at [facility], [place] [address], [place], [place] [address]    Report Status 06/13/2022 FINAL  Final  Culture, blood (Routine x 2)     Status: None   Collection Time: 06/11/22 12:53 PM   Specimen: BLOOD  Result Value Ref Range Status   Specimen Description BLOOD RIGHT ANTECUBITAL  Final   Special Requests   Final    BOTTLES DRAWN AEROBIC AND ANAEROBIC Blood Culture adequate volume   Culture   Final    NO GROWTH 5 DAYS Performed at [facility], [place] [address], [place], [place] [address]    Report Status 06/16/2022 FINAL  Final  Resp Panel by RT-PCR (Flu A&B, Covid) Anterior Nasal Swab     Status: None   Collection Time: 06/11/22  1:21 PM   Specimen: Anterior Nasal Swab  Result Value Ref Range Status   SARS Coronavirus 2 by RT PCR NEGATIVE NEGATIVE Final    Comment: (NOTE) SARS-CoV-2 target nucleic acids are NOT DETECTED.  The SARS-CoV-2 RNA is generally detectable in upper respiratory specimens during the acute phase of infection. The lowest concentration of SARS-CoV-2 viral copies this assay can detect is 138 copies/mL. A negative result does not preclude SARS-Cov-2 infection and should not be used as the sole  basis for treatment or other patient management decisions. A negative result may occur with  improper specimen collection/handling, submission of specimen other than nasopharyngeal swab, presence of viral mutation(s) within the areas targeted by this assay, and inadequate number of viral copies(<138 copies/mL). A negative result must be combined with clinical observations, patient history, and epidemiological information. The expected result is Negative.  Fact Sheet for Patients:  EntrepreneurPulse.com.au  Fact Sheet for Healthcare Providers:  IncredibleEmployment.be  This  test is no t yet approved or cleared by the [place] and  has been authorized for detection and/or diagnosis of SARS-CoV-2 by FDA under an Emergency Use Authorization (EUA). This EUA will remain  in effect (meaning this test can be used) for the duration of the COVID-19 declaration under Section 564(b)(1) of the Act, 21 U.S.C.section 360bbb-3(b)(1), unless the authorization is terminated  or revoked sooner.       Influenza A by PCR NEGATIVE NEGATIVE Final   Influenza B by PCR NEGATIVE NEGATIVE Final    Comment: (NOTE) The Xpert Xpress SARS-CoV-2/FLU/RSV plus assay is intended as an aid in the diagnosis of influenza from Nasopharyngeal swab specimens and should not be used as a sole basis for treatment. Nasal washings and aspirates are unacceptable for Xpert Xpress SARS-CoV-2/FLU/RSV testing.  Fact Sheet for Patients: EntrepreneurPulse.com.au  Fact Sheet for Healthcare Providers: IncredibleEmployment.be  This test is not yet approved or cleared by the [place] FDA and has been authorized for detection and/or diagnosis of SARS-CoV-2 by FDA under an Emergency Use Authorization (EUA). This EUA will remain in effect (meaning this test can be used) for the duration of the COVID-19 declaration under Section 564(b)(1) of the Act,  21 U.S.C. section 360bbb-3(b)(1), unless the authorization is terminated or revoked.  Performed at [facility], [place] [address], [place], [place] [address]   Body fluid culture w Gram Stain     Status: None   Collection Time: 06/11/22  5:18 PM   Specimen: Body Fluid  Result Value Ref Range Status   Specimen Description FLUID PLEURAL RIGHT  Final   Special Requests Normal  Final   Gram Stain   Final    RARE WBC PRESENT, PREDOMINANTLY PMN NO ORGANISMS SEEN    Culture   Final    NO GROWTH 3 DAYS Performed at [place] [address], [place], [place] [address]    Report Status 06/14/2022 FINAL  Final  Culture, blood (Routine x 2)     Status: None (Preliminary result)   Collection Time: 06/12/22  3:45 AM   Specimen: BLOOD  Result Value Ref Range Status   Specimen Description BLOOD SITE NOT SPECIFIED  Final   Special Requests   Final    BOTTLES DRAWN AEROBIC AND ANAEROBIC Blood Culture adequate volume   Culture   Final    NO GROWTH 4 DAYS Performed at [facility], [place] [address], [place], [place] [address]    Report Status PENDING  Incomplete  MRSA Next Gen by PCR, Nasal     Status: None   Collection Time: 06/12/22  7:32 PM   Specimen: Nasal Mucosa; Nasal Swab  Result Value Ref Range Status   MRSA by PCR Next Gen NOT DETECTED NOT DETECTED Final    Comment: (NOTE) The GeneXpert MRSA Assay (FDA approved for NASAL specimens only), is one component of a comprehensive MRSA colonization surveillance program. It is not intended to diagnose MRSA infection nor to guide or monitor treatment for MRSA infections. Test performance is not FDA approved in patients less than 48 years old. Performed at [facility], [place] [address] [place], [place] [address]      Time coordinating discharge: Over 30 minutes  SIGNED:   Phillips Climes, MD  [facility] 06/16/2022, 11:56 AM Pager   If 7PM-7AM, please contact  night-coverage [email or web address] Password [email or web address]

## 2022-06-16 NOTE — Telephone Encounter (Signed)
Pt states she will call back. Took our number to do so.

## 2022-06-16 NOTE — Progress Notes (Signed)
Physical Therapy Treatment Patient Details Name: Allison [name] MRN: [id number] DOB: 04-27-1941 Today's Date: 06/16/2022   History of Present Illness Pt is 46 F admitted to [facility] on 06/11/22 for chest pain, SOB, pleural effusion, elevated troponins. Chest tube placed 10/18. Recently presented in ED (10/9) with similar symptoms. PMH to include HTN, HLD, CAD, COPD, T2DM, PVD, arthritis, and tobacco use.    PT Comments    Pt tolerates treatment well, ambulating for increased distances without use of supplemental oxygen. Pt denies SOB during mobility this session and is able to converse freely when mobilizing. Pt will benefit from continued gait and balance training in an effort to restore independence without a device. PT updates DME recs to [employer] at this time.   Recommendations for follow up therapy are one component of a multi-disciplinary discharge planning process, led by the attending physician.  Recommendations may be updated based on patient status, additional functional criteria and insurance authorization.  Follow Up Recommendations  Home health PT     Assistance Recommended at Discharge Set up Supervision/Assistance  Patient can return home with the following A little help with walking and/or transfers;A little help with bathing/dressing/bathroom;Help with stairs or ramp for entrance;Assist for transportation;Assistance with Landscape architect (4 wheels)    Recommendations for Other Services       Precautions / Restrictions Precautions Precautions: Fall;Other (comment) Precaution Comments: monitor sats Restrictions Weight Bearing Restrictions: No     Mobility  Bed Mobility Overal bed mobility: Modified Independent Bed Mobility: Supine to Sit, Sit to Supine     Supine to sit: Modified independent (Device/Increase time), HOB elevated Sit to supine: Modified independent (Device/Increase time), HOB elevated         Transfers Overall transfer level: Needs assistance Equipment used: Rollator (4 wheels) Transfers: Sit to/from Stand Sit to Stand: Supervision           General transfer comment: verbal cues for hand placement    Ambulation/Gait Ambulation/Gait assistance: Supervision Gait Distance (Feet): 600 Feet Assistive device: Rollator (4 wheels) Gait Pattern/deviations: Step-through pattern Gait velocity: functional Gait velocity interpretation: 1.31 - 2.62 ft/sec, indicative of limited community ambulator   General Gait Details: steady step-through Radio broadcast assistant    Modified Rankin (Stroke Patients Only)       Balance Overall balance assessment: Needs assistance Sitting-balance support: No upper extremity supported, Feet supported Sitting balance-Leahy Scale: Good     Standing balance support: Single extremity supported, Reliant on assistive device for balance Standing balance-Leahy Scale: Poor                              Cognition Arousal/Alertness: Awake/alert Behavior During Therapy: WFL for tasks assessed/performed Overall Cognitive Status: Within Functional Limits for tasks assessed                                          Exercises      General Comments General comments (skin integrity, edema, etc.): pt on 2L [place] upon PT arrival, pt weaned to room air for mobility, sats range from 91-96% when reading reliably. Sats at rest at end of session ranging from 89-93% on RA      Pertinent Vitals/Pain Pain Assessment Pain Assessment: No/denies pain  Home Living                          Prior Function            PT Goals (current goals can now be found in the care plan section) Acute Rehab PT Goals Patient Stated Goal: to go home Progress towards PT goals: Progressing toward goals    Frequency    Min 3X/week      PT Plan Current plan remains appropriate     Co-evaluation              AM-PAC PT "6 Clicks" Mobility   Outcome Measure  Help needed turning from your back to your side while in a flat bed without using bedrails?: None Help needed moving from lying on your back to sitting on the side of a flat bed without using bedrails?: None Help needed moving to and from a bed to a chair (including a wheelchair)?: A Little Help needed standing up from a chair using your arms (e.g., wheelchair or bedside chair)?: A Little Help needed to walk in hospital room?: A Little Help needed climbing 3-5 steps with a railing? : A Lot 6 Click Score: 19    End of Session   Activity Tolerance: Patient tolerated treatment well Patient left: in bed;with call bell/phone within reach;with bed alarm set Nurse Communication: Mobility status PT Visit Diagnosis: Other abnormalities of gait and mobility (R26.89);Muscle weakness (generalized) (M62.81)     Time: 1010-1045 PT Time Calculation (min) (ACUTE ONLY): 35 min  Charges:  $Gait Training: 23-37 mins                     Zenaida Niece, PT, DPT Acute Rehabilitation Office [place] 06/16/2022, 10:54 AM

## 2022-06-16 NOTE — Progress Notes (Signed)
Rounding Note    Patient Name: Allison [name] Date of Encounter: 06/16/2022  [place] Cardiologist: None   Subjective   Patient sitting up in bed this morning. She reports ongoing but stable dyspnea, worst with exertion. She is concerned about this dyspnea restricting her activity level when she goes home, is curious about getting access to portable oxygen condenser. No chest pain since yesterday morning.  Inpatient Medications    Scheduled Meds:  amoxicillin-clavulanate  1 tablet Oral Q12H   aspirin EC  81 mg Oral Daily   enoxaparin (LOVENOX) injection  40 mg Subcutaneous Q24H   feeding supplement (GLUCERNA SHAKE)  237 mL Oral TID BM   fluticasone furoate-vilanterol  1 puff Inhalation Daily   And   umeclidinium bromide  1 puff Inhalation Daily   guaiFENesin  600 mg Oral BID   metoprolol tartrate  12.5 mg Oral BID   multivitamin with minerals  1 tablet Oral Daily   nicotine  7 mg Transdermal Daily   rosuvastatin  40 mg Oral Daily   sodium chloride flush  3 mL Intravenous Q12H   Continuous Infusions:  PRN Meds: acetaminophen **OR** acetaminophen, albuterol, alum & mag hydroxide-simeth, mouth rinse, oxyCODONE   Vital Signs    Vitals:   06/15/22 2328 06/16/22 0325 06/16/22 0745 06/16/22 0828  BP: 138/62 (!) 122/58  100/60  Pulse: 88 84 (!) 108 100  Resp: (!) 25 (!) 21 18 20   Temp: 98.7 F (37.1 C) 98.1 F (36.7 C)  97.6 F (36.4 C)  TempSrc: Oral Oral  Oral  SpO2: 94% 97% 93% 92%  Weight:      Height:        Intake/Output Summary (Last 24 hours) at 06/16/2022 0928 Last data filed at 06/16/2022 0400 Gross per 24 hour  Intake 340 ml  Output 300 ml  Net 40 ml      06/14/2022    6:33 AM 06/11/2022   12:44 PM 12/03/2016   10:36 AM  Last 3 Weights  Weight (lbs) 145 lb 4.5 oz 141 lb 141 lb  Weight (kg) 65.9 kg 63.957 kg 63.957 kg      Telemetry    Normal sinus rhythm. - Personally Reviewed  ECG    10/22 EKG with sinus rhythm, no ischemic  changes - Personally Reviewed  Physical Exam   GEN: No acute distress.   Neck: No JVD Cardiac: RRR, no murmurs, rubs, or gallops.  Respiratory: Clear to auscultation bilaterally. GI: Soft, nontender, non-distended  MS: No edema; No deformity. Neuro:  Nonfocal  Psych: Normal affect   Labs    High Sensitivity Troponin:   Recent Labs  Lab 06/03/22 0238 06/03/22 1009 06/11/22 1301 06/11/22 1448 06/15/22 1428  TROPONINIHS 75* 57* 26* 20* 24*     Chemistry Recent Labs  Lab 06/11/22 1301 06/12/22 0345 06/13/22 0242 06/14/22 0243 06/14/22 0307 06/15/22 0542 06/16/22 0205  NA 137 136   < > 135  --  136 138  K 4.1 4.1   < > 3.7  --  4.6 4.1  CL 100 102   < > 103  --  103 105  CO2 26 24   < > 22  --  22 22  GLUCOSE 128* 152*   < > 133*  --  129* 98  BUN 19 20   < > 18  --  22 22  CREATININE 1.15* 1.18*   < > 1.09*  --  0.97 1.01*  CALCIUM 9.4 9.1   < >  8.9  --  9.1 9.1  MG  --   --   --   --  2.0 2.5* 2.3  PROT 6.7 6.1*  --   --   --   --   --   ALBUMIN 2.7*  --   --   --   --   --   --   AST 29  --   --   --   --   --   --   ALT 22  --   --   --   --   --   --   ALKPHOS 69  --   --   --   --   --   --   BILITOT 0.2*  --   --   --   --   --   --   GFRNONAA 47* 46*   < > 50*  --  58* 55*  ANIONGAP 11 10   < > 10  --  11 11   < > = values in this interval not displayed.    Lipids No results for input(s): "CHOL", "TRIG", "HDL", "LABVLDL", "LDLCALC", "CHOLHDL" in the last 168 hours.  Hematology Recent Labs  Lab 06/14/22 0243 06/15/22 0542 06/16/22 0205  WBC 13.7* 12.8* 11.6*  RBC 4.69 4.61 4.51  HGB 13.3 13.1 12.8  HCT 38.6 37.4 36.8  MCV 82.3 81.1 81.6  MCH 28.4 28.4 28.4  MCHC 34.5 35.0 34.8  RDW 13.2 13.2 13.2  PLT 264 286 277   Thyroid No results for input(s): "TSH", "FREET4" in the last 168 hours.  BNP Recent Labs  Lab 06/11/22 1253  BNP 76.8    DDimer No results for input(s): "DDIMER" in the last 168 hours.   Radiology    ECHOCARDIOGRAM  COMPLETE  Result Date: 06/15/2022    ECHOCARDIOGRAM REPORT   Patient Name:   Seairra ALLYANNA APPLEMAN Date of Exam: 06/15/2022 Medical Rec #:  [id number]    Height:       63.0 in Accession #:    [id number]   Weight:       145.3 lb Date of Birth:  1940/11/11    BSA:          1.688 m Patient Age:    81 years     BP:           127/71 mmHg Patient Gender: F            HR:           76 bpm. Exam Location:  Inpatient Procedure: 2D Echo, Cardiac Doppler and Color Doppler Indications:    I47.2 Ventricular tachycardia  History:        Patient has no prior history of Echocardiogram examinations.                 CAD, Abnormal ECG, COPD, Arrythmias:NSVT,                 Signs/Symptoms:Shortness of Breath and Dyspnea; Risk                 Factors:Hypertension, Dyslipidemia, Current Smoker and Diabetes.  Sonographer:    Roseanna Rainbow RDCS Referring Phys: [id number] DAWOOD S [facility]  Sonographer Comments: Suboptimal parasternal window. IMPRESSIONS  1. Left ventricular ejection fraction, by estimation, is >75%. The left ventricle has normal function. The left ventricle has no regional wall motion abnormalities. Left ventricular diastolic parameters are consistent with Grade I diastolic dysfunction (impaired relaxation).  2. Right  ventricular systolic function is normal. The right ventricular size is normal.  3. The mitral valve is normal in structure. No evidence of mitral valve regurgitation. No evidence of mitral stenosis.  4. The aortic valve was not well visualized. There is mild calcification of the aortic valve. There is mild thickening of the aortic valve. Aortic valve regurgitation is not visualized. No aortic stenosis is present.  5. The inferior vena cava is normal in size with greater than 50% respiratory variability, suggesting right atrial pressure of 3 mmHg. FINDINGS  Left Ventricle: Left ventricular ejection fraction, by estimation, is >75%. The left ventricle has normal function. The left ventricle has no regional wall motion  abnormalities. The left ventricular internal cavity size was normal in size. There is no left ventricular hypertrophy. Left ventricular diastolic parameters are consistent with Grade I diastolic dysfunction (impaired relaxation). Normal left ventricular filling pressure. Right Ventricle: The right ventricular size is normal. Right vetricular wall thickness was not well visualized. Right ventricular systolic function is normal. Left Atrium: Left atrial size was normal in size. Right Atrium: Right atrial size was normal in size. Pericardium: There is no evidence of pericardial effusion. Mitral Valve: The mitral valve is normal in structure. No evidence of mitral valve regurgitation. No evidence of mitral valve stenosis. MV peak gradient, 7.1 mmHg. The mean mitral valve gradient is 2.0 mmHg. Tricuspid Valve: The tricuspid valve is normal in structure. Tricuspid valve regurgitation is not demonstrated. No evidence of tricuspid stenosis. Aortic Valve: The aortic valve was not well visualized. There is mild calcification of the aortic valve. There is mild thickening of the aortic valve. There is mild aortic valve annular calcification. Aortic valve regurgitation is not visualized. No aortic stenosis is present. Aortic valve mean gradient measures 5.9 mmHg. Aortic valve peak gradient measures 11.2 mmHg. Aortic valve area, by VTI measures 1.89 cm. Pulmonic Valve: The pulmonic valve was not well visualized. Pulmonic valve regurgitation is not visualized. No evidence of pulmonic stenosis. Aorta: The aortic root is normal in size and structure. Venous: The inferior vena cava is normal in size with greater than 50% respiratory variability, suggesting right atrial pressure of 3 mmHg. IAS/Shunts: The interatrial septum is aneurysmal. No atrial level shunt detected by color flow Doppler.  LEFT VENTRICLE PLAX 2D LVIDd:         4.00 cm     Diastology LVIDs:         2.30 cm     LV e' medial:    8.27 cm/s LV PW:         0.80 cm      LV E/e' medial:  10.3 LV IVS:        1.00 cm     LV e' lateral:   5.87 cm/s LVOT diam:     1.80 cm     LV E/e' lateral: 14.5 LV SV:         67 LV SV Index:   40 LVOT Area:     2.54 cm  LV Volumes (MOD) LV vol d, MOD A2C: 57.6 ml LV vol d, MOD A4C: 45.9 ml LV vol s, MOD A2C: 12.8 ml LV vol s, MOD A4C: 12.3 ml LV SV MOD A2C:     44.8 ml LV SV MOD A4C:     45.9 ml LV SV MOD BP:      38.2 ml RIGHT VENTRICLE             IVC RV S prime:  14.80 cm/s  IVC diam: 0.80 cm TAPSE (M-mode): 2.2 cm LEFT ATRIUM             Index        RIGHT ATRIUM          Index LA diam:        2.80 cm 1.66 cm/m   RA Area:     9.79 cm LA Vol (A2C):   26.2 ml 15.52 ml/m  RA Volume:   20.60 ml 12.20 ml/m LA Vol (A4C):   28.7 ml 17.00 ml/m LA Biplane Vol: 28.7 ml 17.00 ml/m  AORTIC VALVE AV Area (Vmax):    1.98 cm AV Area (Vmean):   2.01 cm AV Area (VTI):     1.89 cm AV Vmax:           167.48 cm/s AV Vmean:          111.456 cm/s AV VTI:            0.357 m AV Peak Grad:      11.2 mmHg AV Mean Grad:      5.9 mmHg LVOT Vmax:         130.00 cm/s LVOT Vmean:        88.000 cm/s LVOT VTI:          0.265 m LVOT/AV VTI ratio: 0.74  AORTA Ao Root diam: 2.90 cm MITRAL VALVE MV Area (PHT): 3.12 cm     SHUNTS MV Area VTI:   2.30 cm     Systemic VTI:  0.26 m MV Peak grad:  7.1 mmHg     Systemic Diam: 1.80 cm MV Mean grad:  2.0 mmHg MV Vmax:       1.33 m/s MV Vmean:      67.3 cm/s MV Decel Time: 243 msec MV E velocity: 84.90 cm/s MV A velocity: 123.00 cm/s MV E/A ratio:  0.69 Carlyle Dolly MD Electronically signed by Carlyle Dolly MD Signature Date/Time: 06/15/2022/1:38:17 PM    Final     Cardiac Studies   06/15/2022 TTE  IMPRESSIONS     1. Left ventricular ejection fraction, by estimation, is >75%. The left  ventricle has normal function. The left ventricle has no regional wall  motion abnormalities. Left ventricular diastolic parameters are consistent  with Grade I diastolic dysfunction  (impaired relaxation).   2. Right  ventricular systolic function is normal. The right ventricular  size is normal.   3. The mitral valve is normal in structure. No evidence of mitral valve  regurgitation. No evidence of mitral stenosis.   4. The aortic valve was not well visualized. There is mild calcification  of the aortic valve. There is mild thickening of the aortic valve. Aortic  valve regurgitation is not visualized. No aortic stenosis is present.   5. The inferior vena cava is normal in size with greater than 50%  respiratory variability, suggesting right atrial pressure of 3 mmHg.   FINDINGS   Left Ventricle: Left ventricular ejection fraction, by estimation, is  >75%. The left ventricle has normal function. The left ventricle has no  regional wall motion abnormalities. The left ventricular internal cavity  size was normal in size. There is no  left ventricular hypertrophy. Left ventricular diastolic parameters are  consistent with Grade I diastolic dysfunction (impaired relaxation).  Normal left ventricular filling pressure.   Right Ventricle: The right ventricular size is normal. Right vetricular  wall thickness was not well visualized. Right ventricular systolic  function is normal.  Left Atrium: Left atrial size was normal in size.   Right Atrium: Right atrial size was normal in size.   Pericardium: There is no evidence of pericardial effusion.   Mitral Valve: The mitral valve is normal in structure. No evidence of  mitral valve regurgitation. No evidence of mitral valve stenosis. MV peak  gradient, 7.1 mmHg. The mean mitral valve gradient is 2.0 mmHg.   Tricuspid Valve: The tricuspid valve is normal in structure. Tricuspid  valve regurgitation is not demonstrated. No evidence of tricuspid  stenosis.   Aortic Valve: The aortic valve was not well visualized. There is mild  calcification of the aortic valve. There is mild thickening of the aortic  valve. There is mild aortic valve annular  calcification. Aortic valve  regurgitation is not visualized. No  aortic stenosis is present. Aortic valve mean gradient measures 5.9 mmHg.  Aortic valve peak gradient measures 11.2 mmHg. Aortic valve area, by VTI  measures 1.89 cm.   Pulmonic Valve: The pulmonic valve was not well visualized. Pulmonic valve  regurgitation is not visualized. No evidence of pulmonic stenosis.   Aorta: The aortic root is normal in size and structure.   Venous: The inferior vena cava is normal in size with greater than 50%  respiratory variability, suggesting right atrial pressure of 3 mmHg.   IAS/Shunts: The interatrial septum is aneurysmal. No atrial level shunt  detected by color flow Doppler.   Patient Profile     81 y.o. female with a hx of COPD, T2DM, PAD, tobacco use who is being seen 06/14/2022 for the evaluation of NSVT. She had been seen in the ED on 10/10 and presentation was concerning for ammonia and chest x-ray showed moderate right-sided pleural effusion.  She was discharged on 7-day course of Levaquin.  She reported worsening shortness of breath, cough, and weakness prompting her to present back to the ED on 10/18.  Chest x-ray showed enlarging right pleural effusion.  Assessment & Plan    Non-sustained ventricular tachycardia  Cardiology consulted following an asymptomatic 16 beat run of NSVT on 10/21. Patient without known cardiac history though significant coronary artery calcifications on 06/11/22 chest CT. 10/22 TTE with hyperdynamic LV, EF>75%, no wall motion abnormalities.  Patient on Metoprolol Tartrate 12.5mg  BID, no recurrence of NSVT on tele. Goal potassium >4, magnesium >2  CAD  Patient with probably CAD given calcifications noted on 10/18 chest CT. No recurrence of chest pain following yesterday's episode that was responsive to Maalox. Troponin trend 26, 20, 24. ECG without ischemic changes, TTE shows preserved EF, no wall motion abnormalities.  Continue  Rosuvastatin Continue ASA If patient continues to have exertional dyspnea despite improvement in pulmonary conditions, may need to consider ischemic evaluation in outpatient setting.   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 Now s/p chest tube placement 10/18 and subsequent removal 10/21. Patient subjectively reports ongoing mild dyspnea. She was able to tolerate an ambulation oxygen test with lowest reading of 91% on room air.   Pneumonia  Patient receiving Cefepime per primary team.  COPD  Patient reportedly smokes 1/4 pack per day. No significant wheezing on exam today.   Continue Trelegy and albuterol per primary team.     For questions or updates, please contact [place] Please consult [email or web address] for contact info under        Signed, Lily Kocher, PA-C  06/16/2022, 9:28 AM

## 2022-06-16 NOTE — Telephone Encounter (Signed)
Please call and get patient scheduled for new patient and hospital follow up. With any NP or MD  30 min slot please  2 weeks from discharge  Thank you

## 2022-06-16 NOTE — TOC Transition Note (Signed)
Transition of Care [facility]) - CM/SW Discharge Note   Patient Details  Name: Allison [name] MRN: [id number] Date of Birth: [birth year]  Transition of Care [facility]) CM/SW Contact:  Bethena Roys, RN Phone Number: 06/16/2022, 12:29 PM   Clinical Narrative:  Case Manager spoke with patient this morning and patient still declines home health physical therapy. Patient states she has family support and that she will not benefit from therapy. No further needs from Case Manager @ this time.     Final next level of care: Home/Self Care Barriers to Discharge: No Barriers Identified   Patient Goals and CMS Choice Patient states their goals for this hospitalization and ongoing recovery are:: to return to home CMS Medicare.gov Compare Post Acute Care list provided to:: Patient Choice offered to / list presented to : Patient   Discharge Plan and Services   Discharge Planning Services: CM Consult Post Acute Care Choice: Home Health          DME Arranged: N/A       HH Arranged: Refused HH   Readmission Risk Interventions     No data to display

## 2022-06-17 ENCOUNTER — Other Ambulatory Visit: Payer: Self-pay

## 2022-06-17 ENCOUNTER — Ambulatory Visit (HOSPITAL_COMMUNITY): Payer: Medicare HMO

## 2022-06-17 DIAGNOSIS — C3490 Malignant neoplasm of unspecified part of unspecified bronchus or lung: Secondary | ICD-10-CM

## 2022-06-17 LAB — CULTURE, BLOOD (ROUTINE X 2)
Culture: NO GROWTH
Special Requests: ADEQUATE

## 2022-06-17 NOTE — Progress Notes (Signed)
Spoke with Cydney in Pathology to send recent sample of pleural fluid for molecular studies and PDL-1 per Dr.Mohamed request.

## 2022-06-17 NOTE — Progress Notes (Deleted)
Cardiology Office Note:    Date:  06/17/2022   ID:  Keegan, Bensch 16-Oct-1940, MRN [id number]  PCP:  Seward Carol, MD  Cardiologist:  None  Electrophysiologist:  None   Referring MD: Seward Carol, MD   Chief Complaint: hospital follow-up of non-sustained VT  History of Present Illness:    Allison [name] is a 81 y.o. female with a history of CAD with significant coronary artery calcifications noted on recent chest CT in 05/2022, PAD s/p right femoropopliteal artery bypass in 2011, large right pleural effusion s/p chest tube in 05/2022, hypertension, hyperlipidemia, type 2 diabetes mellitus, COPD, and tobacco abuse who is followed by Dr. Gardiner Rhyme and presents today for hospital follow-up of non-sustained VT.  Patient was first seen by Cardiology during recent hospitalization. She was admitted from 06/11/2022 to 06/16/2022 for acute hypoxic respiratory failure secondary to a parapneumonic pleural effusion. She had been seen in the ED on 06/03/2022 and was diagnosed with pneumonia. Chest x-ray at that time showed a moderate right sided pleural effusion and she was discharged on 7 days of Levaquin. She then developed worsening shortness of breath, cough, and weakness which is what led to hospitalization. Chest x-ray showed an enlarging pleural effusion. Chest CT showed moderate to large loculated right pleural effusion (increased in size compared to prior exam) with associated pleural nodularity and nodular fissural thickening highly concerning for malignant pleural effusion. Also showed enlarged right cardiophrenic angle lymph nodes and a lucent lesion of the first left rib concerning for metastatic disease as well as a low density thickening of the bilateral adrenal glands possibly due to adenomas although metastatic disease could not be excluded. Pleural effusion was treated with a chest tube. Cytology was suspicious for malignancy and showed atypical large cells suspicious for tumor with  numerous background chronic inflammatory cells. Patient was referred to Oncology as an outpatient. Patient was noted to have one 16 second episode of NSVT during admission for which Cardiology was consulted. High-sensitivity troponin was minimally elevated and flat peaking at 26 not consistent with ACS. She did have one episode of chest pain during admission. He was noted to have severe left main and 3 vessel coronary artery calcifications on chest CT but chest pain was resolved with Maalox and was felt to be due to GERD. Echo showed LVEF of >75% with no regional wall motion abnormalities and grade 1 diastolic dysfunction. No additional ischemic evaluation was felt to be necessary at that time. She was started on Lopressor with no recurrence.  Patient presents today for follow-up. ***  CAD Recent chest CT in 05/2022 showed severe left main and 3 vessel coronary artery calcifications. Echo showed LVEF of >75% with no regional wall motion abnormalities.  - No chest pain. - Continue aspirin and high-intensity statin. - Ischemic evaluation ***  Non-Sustained VT Patient was recently admitted with acute hypoxic respiratory failure secondary to a large pleural effusion (likely from malignancy) and was noted to have one 16 second episode of NSVT. - No palpitations.  - Continue Lopressor 12.5mg  twice daily.  PAD S/p right femoropopliteal artery bypass in 2011. Last ABIs in 2018 were normal. - Continue aspirin and statin. - Followed by Vascular Surgery.  Hypertension BP well controlled. *** - Continue Lopressor 12.5mg  twice daily.  Hyperlipidemia Most recent lipid panel ***: - Continue Crestor 40mg  daily.  Type 2 Diabetes Mellitus Hemoglobin A1c *** - Not currently on any medications. - Management per PCP.  Lung Cancer  ***   Past  Medical History:  Diagnosis Date   Arthritis    CAD (coronary artery disease)    COPD (chronic obstructive pulmonary disease) ([place])    Diabetes mellitus  without complication ([place])    Type II   Hyperlipidemia    Hypertension    Peripheral vascular disease ([place])     Past Surgical History:  Procedure Laterality Date   ABDOMINAL HYSTERECTOMY     APPENDECTOMY  2006   bone spur removal     Both feet   PR VEIN BYPASS GRAFT,AORTO-FEM-POP  04-25-2010   Right Fem-Pop BPG    Current Medications: No outpatient medications have been marked as taking for the 06/25/22 encounter (Appointment) with Darreld Mclean, PA-C.     Allergies:   Cefaclor, Minocycline hcl, Nitrofurantoin, and Sucralfate   Social History   Socioeconomic History   Marital status: Married    Spouse name: Not on file   Number of children: Not on file   Years of education: Not on file   Highest education level: Not on file  Occupational History   Not on file  Tobacco Use   Smoking status: Some Days    Packs/day: 0.25    Types: Cigarettes   Smokeless tobacco: Never  Substance and Sexual Activity   Alcohol use: No    Alcohol/week: 0.0 standard drinks of alcohol   Drug use: No   Sexual activity: Not on file  Other Topics Concern   Not on file  Social History Narrative   Not on file   Social Determinants of Health   Financial Resource Strain: Not on file  Food Insecurity: Not on file  Transportation Needs: Not on file  Physical Activity: Not on file  Stress: Not on file  Social Connections: Not on file     Family History: The patient's Family history is unknown by patient.  ROS:   Please see the history of present illness.     EKGs/Labs/Other Studies Reviewed:    The following studies were reviewed:  Chest CT 06/14/22: Impressions: 1. Moderate to large loculated right pleural effusion, increased in size when compared with prior exam. There is associated pleural nodularity and nodular fissural thickening, highly concerning for malignant pleural effusion. Recommend further evaluation with fluid sampling. 2. Enlarged right cardiophrenic angle  lymph nodes, concerning for metastatic disease. 3. Low-density thickening of the bilateral adrenal glands possibly due to adenomas, although metastatic disease can not be excluded. Recommend attention on follow-up. 4. Lucent lesion of the first left rib with associated subacute appearing fracture, concerning for osseous metastatic disease and pathologic fracture. 5. Severe left main and three-vessel coronary artery calcifications. 6. Aortic Atherosclerosis (ICD10-I70.0) and Emphysema (ICD10-J43.9).  _______________  Echocardiogram 06/15/2022: Impressions:  1. Left ventricular ejection fraction, by estimation, is >75%. The left  ventricle has normal function. The left ventricle has no regional wall  motion abnormalities. Left ventricular diastolic parameters are consistent  with Grade I diastolic dysfunction  (impaired relaxation).   2. Right ventricular systolic function is normal. The right ventricular  size is normal.   3. The mitral valve is normal in structure. No evidence of mitral valve  regurgitation. No evidence of mitral stenosis.   4. The aortic valve was not well visualized. There is mild calcification  of the aortic valve. There is mild thickening of the aortic valve. Aortic  valve regurgitation is not visualized. No aortic stenosis is present.   5. The inferior vena cava is normal in size with greater than 50%  respiratory variability,  suggesting right atrial pressure of 3 mmHg.  EKG:  EKG not ordered today.  Recent Labs: 06/11/2022: ALT 22; B Natriuretic Peptide 76.8 06/16/2022: BUN 22; Creatinine, Ser 1.01; Hemoglobin 12.8; Magnesium 2.3; Platelets 277; Potassium 4.1; Sodium 138  Recent Lipid Panel No results found for: "CHOL", "TRIG", "HDL", "CHOLHDL", "VLDL", "LDLCALC", "LDLDIRECT"  Physical Exam:    Vital Signs: There were no vitals taken for this visit.    Wt Readings from Last 3 Encounters:  06/14/22 145 lb 4.5 oz (65.9 kg)  12/03/16 141 lb (64 kg)   08/03/15 143 lb 6.4 oz (65 kg)     General: 81 y.o. female in no acute distress. HEENT: Normocephalic and atraumatic. Sclera clear. EOMs intact. Neck: Supple. No carotid bruits. No JVD. Heart: *** RRR. Distinct S1 and S2. No murmurs, gallops, or rubs. Radial and distal pedal pulses 2+ and equal bilaterally. Lungs: No increased work of breathing. Clear to ausculation bilaterally. No wheezes, rhonchi, or rales.  Abdomen: Soft, non-distended, and non-tender to palpation. Bowel sounds present in all 4 quadrants.  MSK: Normal strength and tone for age. *** Extremities: No lower extremity edema.    Skin: Warm and dry. Neuro: Alert and oriented x3. No focal deficits. Psych: Normal affect. Responds appropriately.   Assessment:    No diagnosis found.  Plan:     Disposition: Follow up in ***   Medication Adjustments/Labs and Tests Ordered: Current medicines are reviewed at length with the patient today.  Concerns regarding medicines are outlined above.  No orders of the defined types were placed in this encounter.  No orders of the defined types were placed in this encounter.   There are no Patient Instructions on file for this visit.   Signed, Darreld Mclean, PA-C  06/17/2022 11:05 AM    [place]

## 2022-06-18 ENCOUNTER — Encounter: Payer: Medicare HMO | Admitting: Vascular Surgery

## 2022-06-18 NOTE — Telephone Encounter (Signed)
Called and spoke with patient.  Offered to schedule patient hospital follow up appointment, but patient declined.  Patient stated she was scheduled with PCP 06/25/22 and has several other appointments to make before she could schedule her pulmonary appointment.  Patient was given [facility] number to call to schedule appointment.   Patient stated she was feeling better, but was still having some sob with exertion.  Advised patient of importance with following up with pulmonary for sob.  Patient stated she would call to schedule.   Will follow up if needed for scheduling hospital follow up.

## 2022-06-23 ENCOUNTER — Ambulatory Visit
Admission: RE | Admit: 2022-06-23 | Discharge: 2022-06-23 | Disposition: A | Discharge status: Home / Self Care | Payer: Medicare HMO | Source: Ambulatory Visit | Attending: Internal Medicine | Admitting: Internal Medicine

## 2022-06-23 ENCOUNTER — Other Ambulatory Visit: Payer: Self-pay | Admitting: Internal Medicine

## 2022-06-23 DIAGNOSIS — J189 Pneumonia, unspecified organism: Secondary | ICD-10-CM | POA: Diagnosis not present

## 2022-06-23 DIAGNOSIS — J929 Pleural plaque without asbestos: Secondary | ICD-10-CM | POA: Diagnosis not present

## 2022-06-23 DIAGNOSIS — R899 Unspecified abnormal finding in specimens from other organs, systems and tissues: Secondary | ICD-10-CM | POA: Diagnosis not present

## 2022-06-23 DIAGNOSIS — J984 Other disorders of lung: Secondary | ICD-10-CM | POA: Diagnosis not present

## 2022-06-23 DIAGNOSIS — Z8701 Personal history of pneumonia (recurrent): Secondary | ICD-10-CM | POA: Diagnosis not present

## 2022-06-23 DIAGNOSIS — J90 Pleural effusion, not elsewhere classified: Secondary | ICD-10-CM | POA: Diagnosis not present

## 2022-06-23 DIAGNOSIS — R918 Other nonspecific abnormal finding of lung field: Secondary | ICD-10-CM | POA: Diagnosis not present

## 2022-06-25 ENCOUNTER — Ambulatory Visit: Payer: Medicare HMO | Attending: Student | Admitting: Student

## 2022-06-30 ENCOUNTER — Encounter: Payer: Self-pay | Admitting: Student

## 2022-07-02 NOTE — Telephone Encounter (Signed)
Pulmonary referral was placed for patient 06/25/22 for hospital follow up.  Patient has been

## 2022-07-02 NOTE — Telephone Encounter (Signed)
Pulmonary referral was placed 06/25/22 for hospital follow up.  Patient has been contacted again and message left for patient to call office to schedule per referral note.

## 2022-07-04 ENCOUNTER — Encounter (HOSPITAL_COMMUNITY)
Admission: RE | Admit: 2022-07-04 | Discharge: 2022-07-04 | Disposition: A | Discharge status: Home / Self Care | Payer: Medicare HMO | Source: Ambulatory Visit | Attending: Internal Medicine | Admitting: Internal Medicine

## 2022-07-04 DIAGNOSIS — R918 Other nonspecific abnormal finding of lung field: Secondary | ICD-10-CM | POA: Diagnosis not present

## 2022-07-04 DIAGNOSIS — C3490 Malignant neoplasm of unspecified part of unspecified bronchus or lung: Secondary | ICD-10-CM | POA: Diagnosis not present

## 2022-07-04 LAB — GLUCOSE, CAPILLARY: Glucose-Capillary: 156 mg/dL — ABNORMAL HIGH (ref 70–99)

## 2022-07-04 MED ORDER — FLUDEOXYGLUCOSE F - 18 (FDG) INJECTION
7.5000 | Freq: Once | INTRAVENOUS | Status: AC
  Administered 2022-07-04: 7.5 via INTRAVENOUS

## 2022-07-08 ENCOUNTER — Other Ambulatory Visit: Payer: Self-pay | Admitting: Internal Medicine

## 2022-07-08 ENCOUNTER — Other Ambulatory Visit: Payer: Self-pay

## 2022-07-08 DIAGNOSIS — C3491 Malignant neoplasm of unspecified part of right bronchus or lung: Secondary | ICD-10-CM

## 2022-07-08 DIAGNOSIS — C3490 Malignant neoplasm of unspecified part of unspecified bronchus or lung: Secondary | ICD-10-CM

## 2022-07-09 ENCOUNTER — Other Ambulatory Visit: Payer: Self-pay

## 2022-07-09 ENCOUNTER — Encounter: Payer: Self-pay | Admitting: Internal Medicine

## 2022-07-09 ENCOUNTER — Inpatient Hospital Stay: Payer: Medicare HMO

## 2022-07-09 ENCOUNTER — Inpatient Hospital Stay: Payer: Medicare HMO | Attending: Internal Medicine | Admitting: Internal Medicine

## 2022-07-09 VITALS — BP 107/75 | HR 111 | Temp 97.6°F | Resp 16 | Ht 63.0 in | Wt 105.4 lb

## 2022-07-09 DIAGNOSIS — C3490 Malignant neoplasm of unspecified part of unspecified bronchus or lung: Secondary | ICD-10-CM

## 2022-07-09 DIAGNOSIS — M1990 Unspecified osteoarthritis, unspecified site: Secondary | ICD-10-CM | POA: Diagnosis not present

## 2022-07-09 DIAGNOSIS — C7951 Secondary malignant neoplasm of bone: Secondary | ICD-10-CM | POA: Diagnosis not present

## 2022-07-09 DIAGNOSIS — I2510 Atherosclerotic heart disease of native coronary artery without angina pectoris: Secondary | ICD-10-CM | POA: Diagnosis not present

## 2022-07-09 DIAGNOSIS — I119 Hypertensive heart disease without heart failure: Secondary | ICD-10-CM | POA: Insufficient documentation

## 2022-07-09 DIAGNOSIS — C3491 Malignant neoplasm of unspecified part of right bronchus or lung: Secondary | ICD-10-CM

## 2022-07-09 DIAGNOSIS — E785 Hyperlipidemia, unspecified: Secondary | ICD-10-CM | POA: Insufficient documentation

## 2022-07-09 DIAGNOSIS — J432 Centrilobular emphysema: Secondary | ICD-10-CM | POA: Diagnosis not present

## 2022-07-09 DIAGNOSIS — Z7982 Long term (current) use of aspirin: Secondary | ICD-10-CM | POA: Insufficient documentation

## 2022-07-09 DIAGNOSIS — Z79899 Other long term (current) drug therapy: Secondary | ICD-10-CM | POA: Diagnosis not present

## 2022-07-09 DIAGNOSIS — Z87891 Personal history of nicotine dependence: Secondary | ICD-10-CM | POA: Diagnosis not present

## 2022-07-09 LAB — CMP (CANCER CENTER ONLY)
ALT: 12 U/L (ref 0–44)
AST: 21 U/L (ref 15–41)
Albumin: 3 g/dL — ABNORMAL LOW (ref 3.5–5.0)
Alkaline Phosphatase: 81 U/L (ref 38–126)
Anion gap: 17 — ABNORMAL HIGH (ref 5–15)
BUN: 19 mg/dL (ref 8–23)
CO2: 18 mmol/L — ABNORMAL LOW (ref 22–32)
Calcium: 9.3 mg/dL (ref 8.9–10.3)
Chloride: 104 mmol/L (ref 98–111)
Creatinine: 1.03 mg/dL — ABNORMAL HIGH (ref 0.44–1.00)
GFR, Estimated: 54 mL/min — ABNORMAL LOW (ref >60)
Glucose, Bld: 121 mg/dL — ABNORMAL HIGH (ref 70–99)
Potassium: 3.8 mmol/L (ref 3.5–5.1)
Sodium: 139 mmol/L (ref 135–145)
Total Bilirubin: 0.8 mg/dL (ref 0.3–1.2)
Total Protein: 7.1 g/dL (ref 6.5–8.1)

## 2022-07-09 LAB — CBC WITH DIFFERENTIAL/PLATELET
Abs Immature Granulocytes: 0.06 10*3/uL (ref 0.00–0.07)
Basophils Absolute: 0.1 10*3/uL (ref 0.0–0.1)
Basophils Relative: 1 %
Eosinophils Absolute: 0 10*3/uL (ref 0.0–0.5)
Eosinophils Relative: 0 %
HCT: 38.6 % (ref 36.0–46.0)
Hemoglobin: 13.1 g/dL (ref 12.0–15.0)
Immature Granulocytes: 1 %
Lymphocytes Relative: 10 %
Lymphs Abs: 1 10*3/uL (ref 0.7–4.0)
MCH: 28.1 pg (ref 26.0–34.0)
MCHC: 33.9 g/dL (ref 30.0–36.0)
MCV: 82.7 fL (ref 80.0–100.0)
Monocytes Absolute: 0.6 10*3/uL (ref 0.1–1.0)
Monocytes Relative: 7 %
Neutro Abs: 8 10*3/uL — ABNORMAL HIGH (ref 1.7–7.7)
Neutrophils Relative %: 81 %
Platelets: 200 10*3/uL (ref 150–400)
RBC: 4.67 MIL/uL (ref 3.87–5.11)
RDW: 14.4 % (ref 11.5–15.5)
WBC: 9.7 10*3/uL (ref 4.0–10.5)
nRBC: 0 % (ref 0.0–0.2)

## 2022-07-09 MED ORDER — OXYCODONE-ACETAMINOPHEN 5-325 MG PO TABS: 1.0000 | ORAL_TABLET | Freq: Three times a day (TID) | ORAL | 0 refills | Status: AC | PRN

## 2022-07-09 NOTE — Progress Notes (Signed)
[place] [phone]   [phone]  CONSULT NOTE  REFERRING PHYSICIAN: Dr. Leory Plowman Icard  REASON FOR CONSULTATION:  81 years old African-American female recently diagnosed with lung cancer.  HPI Allison [name] is a 81 y.o. female with past medical history significant for hypertension, dyslipidemia, COPD, coronary artery disease, diabetes mellitus as well as peripheral vascular disease and osteoarthritis.  The patient presented to the emergency department at [facility] on June 11, 2022 complaining of chest pain and shortness of breath.  She was diagnosed a week earlier with suspicious pneumonia and treated with a 7-day course of Levaquin with no improvement in her condition.  She had CT scanning of the chest without contrast on June 11, 2022 and it showed moderate to large loculated right pleural effusion increased in size when compared to the prior exam.  There was associated pleural nodularity and nodular fissural thickening highly concerning for malignant pleural effusion.  There was also enlarged right cardiophrenic angle lymph node concerning for metastatic disease.  There was lucent lesion of the first left rib with associated subacute appearing fracture concerning for osseous metastatic disease and pathologic fracture.  The patient was seen by Dr. Valeta Harms and she had a chest tube placed for drainage of the right pleural effusion on 06/11/2022.  The final cytology ([id number] ) showed atypical large cells suspicious for malignancy.  The patient was referred to me today for evaluation and recommendation regarding management of her condition.  I ordered a PET scan which was performed on July 04, 2022 and it showed a trend of intensely hypermetabolic pleural thickening in the right hemothorax consistent with metastatic lung cancer.  There was evidence of nodal metastasis in the pericardial space adjacent to the heart and the right diaphragm.   There was hypermetabolic left chest wall lesion with expansion of the left first rib.  There was additional skeletal metastasis involving the L3 vertebral body, T12 vertebral body and pelvis.  There was also high suspicious of muscular metastasis to the left thigh musculature.   The patient was referred to me today for evaluation and recommendation regarding treatment of her condition. When seen today she continues to complain of generalized fatigue and weakness.  She is tearful at times because of her condition.  She also has shortness of breath at baseline increased with exertion and she lost around 10 pounds in the last few weeks.  She lives at home with her husband who is 26 but still taking good care of her.  She denied having any current chest pain but has mild cough with no hemoptysis.  She has no nausea but has occasional vomiting with no abdominal pain, diarrhea or constipation.  She has no headache or visual changes. Family history is unknown since she was raised by her grandmother. The patient is married and her husband is 2.  She has 1 daughter and 1 granddaughter.  She used to work for the [employer].  She has a history for smoking for around 7 years and quit 2 months ago.  She drinks alcohol occasionally no history of drug abuse. HPI  Past Medical History:  Diagnosis Date   Arthritis    CAD (coronary artery disease)    COPD (chronic obstructive pulmonary disease) ([place])    Diabetes mellitus without complication ([place])    Type II   Hyperlipidemia    Hypertension    Peripheral vascular disease ([place])     Past Surgical History:  Procedure Laterality Date   ABDOMINAL HYSTERECTOMY     APPENDECTOMY  2006   bone spur removal     Both feet   PR VEIN BYPASS GRAFT,AORTO-FEM-POP  04-25-2010   Right Fem-Pop BPG    Family History  Family history unknown: Yes    Social History Social History   Tobacco Use   Smoking status: Some Days    Packs/day: 0.25    Types:  Cigarettes   Smokeless tobacco: Never  Substance Use Topics   Alcohol use: No    Alcohol/week: 0.0 standard drinks of alcohol   Drug use: No    Allergies  Allergen Reactions   Cefaclor Other (See Comments)    UNK reaction, patient says she has never had allergic rxn to antibiotic    Minocycline Hcl Other (See Comments)    Unk reaction   Nitrofurantoin Other (See Comments)    UNK reaction   Sucralfate Other (See Comments)    Unk reaction    Current Outpatient Medications  Medication Sig Dispense Refill   acetaminophen (TYLENOL) 325 MG tablet Take 2 tablets (650 mg total) by mouth every 6 (six) hours as needed for mild pain (or Fever >/= 101).     amoxicillin-clavulanate (AUGMENTIN) 875-125 MG tablet Take 1 tablet by mouth every 12 (twelve) hours. 12 tablet 0   aspirin EC 81 MG tablet Take 1 tablet (81 mg total) by mouth daily. Swallow whole. 30 tablet 1   lidocaine (LIDODERM) 5 % Place 1 patch onto the skin daily. Remove & Discard patch within 12 hours or as directed by MD 30 patch 0   metoprolol tartrate (LOPRESSOR) 25 MG tablet Take 0.5 tablets (12.5 mg total) by mouth 2 (two) times daily. 30 tablet 1   nicotine (NICODERM CQ - DOSED IN MG/24 HR) 7 mg/24hr patch Place 1 patch (7 mg total) onto the skin daily. 28 patch 0   Rosuvastatin Calcium 40 MG CPSP Take 40 mg by mouth daily.     TRELEGY ELLIPTA 100-62.5-25 MCG/ACT AEPB Take 1 puff by mouth daily.     No current facility-administered medications for this visit.    Review of Systems  Constitutional: positive for fatigue and weight loss Eyes: negative Ears, nose, mouth, throat, and face: negative Respiratory: positive for dyspnea on exertion Cardiovascular: negative Gastrointestinal: positive for vomiting Genitourinary:negative Integument/breast: negative Hematologic/lymphatic: negative Musculoskeletal:positive for arthralgias and muscle weakness Neurological: negative Behavioral/Psych: negative Endocrine:  negative Allergic/Immunologic: negative  Physical Exam  UTM:LYYTK, healthy, no distress, well nourished, well developed, anxious, and crying SKIN: skin color, texture, turgor are normal, no rashes or significant lesions HEAD: Normocephalic, No masses, lesions, tenderness or abnormalities EYES: normal, PERRLA, Conjunctiva are pink and non-injected EARS: External ears normal, Canals clear OROPHARYNX:no exudate, no erythema, and lips, buccal mucosa, and tongue normal  NECK: supple, no adenopathy, no JVD LYMPH:  no palpable lymphadenopathy, no hepatosplenomegaly BREAST:not examined LUNGS: coarse sounds heard, decreased breath sounds HEART: regular rate & rhythm, no murmurs, and no gallops ABDOMEN:abdomen soft, non-tender, normal bowel sounds, and no masses or organomegaly BACK: Back symmetric, no curvature., No CVA tenderness EXTREMITIES:no joint deformities, effusion, or inflammation, no edema  NEURO: alert & oriented x 3 with fluent speech, no focal motor/sensory deficits  PERFORMANCE STATUS: ECOG 1  LABORATORY DATA: Lab Results  Component Value Date   WBC 11.6 (H) 06/16/2022   HGB 12.8 06/16/2022   HCT 36.8 06/16/2022   MCV 81.6 06/16/2022   PLT 277 06/16/2022      Chemistry  Component Value Date/Time   NA 138 06/16/2022 0205   K 4.1 06/16/2022 0205   CL 105 06/16/2022 0205   CO2 22 06/16/2022 0205   BUN 22 06/16/2022 0205   CREATININE 1.01 (H) 06/16/2022 0205      Component Value Date/Time   CALCIUM 9.1 06/16/2022 0205   ALKPHOS 69 06/11/2022 1301   AST 29 06/11/2022 1301   ALT 22 06/11/2022 1301   BILITOT 0.2 (L) 06/11/2022 1301       RADIOGRAPHIC STUDIES: NM PET Image Initial (PI) Skull Base To Thigh  Result Date: 07/07/2022 CLINICAL DATA:  Subsequent treatment strategy for non-small cell lung cancer. EXAM: NUCLEAR MEDICINE PET SKULL BASE TO THIGH TECHNIQUE: 6.2 mCi F-18 FDG was injected intravenously. Full-ring PET imaging was performed from the skull  base to thigh after the radiotracer. CT data was obtained and used for attenuation correction and anatomic localization. Fasting blood glucose: 156 mg/dl COMPARISON:  None Available. FINDINGS: Mediastinal blood pool activity: SUV max 3.2 Liver activity: SUV max NA NECK: Hypermetabolism of the RIGHT vocal cord compared to the LEFT. Incidental CT findings: None. CHEST: There is a rind of intense hypermetabolic pleural thickening involving the entirety of the RIGHT hemithorax. Activity at the lung bases with SUV max equal 5.6 for example. Hypermetabolic thickening in the azygoesophageal recess with SUV max equal 6.3. Intense hypermetabolic thickening in the anterior RIGHT middle lobe with additional nodular pericardial nodes SUV max equal 8.9. Periportal lymph node measuring 13 mm on image 87/4. Intense metabolic activity in the LEFT apical chest wall localized to the first rib with SUV max equal 9.8. There is bone destruction at this level on CT portion exam (image 45/4 Incidental CT findings: None. ABDOMEN/PELVIS: Adrenal glands normal. No hypermetabolic lesions in liver. Hypermetabolic abdominopelvic lymph nodes. Incidental CT findings: None. SKELETON: Intense metabolic activity associated with the L3 vertebral body SUV max equal 9.5. There is a lytic lesion on the CT portion exam associated with this hypermetabolic activity. Hypermetabolic lesion in the anterior RIGHT acetabulum SUV max equal 9.7. Subtle lesion in the LEFT sacral ala with SUV max equal 5.3 on image 140. Focal lesion in the T12 vertebral body with SUV max equal 8.3 Within the musculature of the LEFT thigh there is intense metabolic lesion along the dorsal surface with SUV max equal 7.2 on image 199 Incidental CT findings: None. IMPRESSION: 1. Rind of intensely hypermetabolic pleural thickening the RIGHT hemithorax consistent with metastatic lung cancer. 2. Evidence of nodal metastasis in the precordial space adjacent to the heart and RIGHT  diaphragm. 3. Hypermetabolic LEFT chest wall lesion with expansion of the LEFT first rib. 4. Additional skeletal metastasis involving the L3 vertebral body, T12 vertebral body, and pelvis. 5. High suspicion for muscular metastasis to the LEFT thigh musculature. Electronically Signed   By: Suzy Bouchard M.D.   On: 07/07/2022 13:28   DG Chest Special View  Result Date: 06/25/2022 CLINICAL DATA:  Right lung effusion, pneumonia EXAM: CHEST SPECIAL VIEW COMPARISON:  Two-view chest radiographs, 06/23/2022 FINDINGS: Right-sided decubitus radiographs of the chest demonstrate an unchanged, loculated small right pleural effusion without evidence of free flow with associated atelectasis or consolidation. The left lung is normally aerated. IMPRESSION: Right-sided decubitus radiographs of the chest demonstrate an unchanged, loculated small right pleural effusion without evidence of free flow with associated atelectasis or consolidation. Electronically Signed   By: Delanna Ahmadi M.D.   On: 06/25/2022 15:25   DG Chest 2 View  Result Date: 06/25/2022 CLINICAL DATA:  81 year old female with pneumonia EXAM: CHEST - 2 VIEW COMPARISON:  Multiple prior most recent 06/14/2022 FINDINGS: Cardiomediastinal silhouette unchanged in size and contour Interval movable of the right-sided chest tube.  No pneumothorax. Improving airspace opacity at the right lung base over the course of recent chest x-rays. Persisting reticulonodular opacities in a predominantly linear configuration with some pleural thickening persisting at the right lung base. Blunting of the right costophrenic angle. Blunting of the left costophrenic angle. Thickening of the fissures on the lateral view. Left lung relatively well aerated. Degenerative changes the spine.  No acute displaced fracture IMPRESSION: Interval removal of the right-sided chest tube with no pneumothorax. Improving airspace disease at the right lung base, with most likely residual/developing  post infectious scarring. Trace residual pleural fluid bilaterally. Electronically Signed   By: Corrie Mckusick D.O.   On: 06/25/2022 10:32   ECHOCARDIOGRAM COMPLETE  Result Date: 06/15/2022    ECHOCARDIOGRAM REPORT   Patient Name:   Allison [name] Date of Exam: 06/15/2022 Medical Rec #:  [id number]    Height:       63.0 in Accession #:    [id number]   Weight:       145.3 lb Date of Birth:  Jan 26, 1941    BSA:          1.688 m Patient Age:    58 years     BP:           127/71 mmHg Patient Gender: F            HR:           76 bpm. Exam Location:  Inpatient Procedure: 2D Echo, Cardiac Doppler and Color Doppler Indications:    I47.2 Ventricular tachycardia  History:        Patient has no prior history of Echocardiogram examinations.                 CAD, Abnormal ECG, COPD, Arrythmias:NSVT,                 Signs/Symptoms:Shortness of Breath and Dyspnea; Risk                 Factors:Hypertension, Dyslipidemia, Current Smoker and Diabetes.  Sonographer:    Roseanna Rainbow RDCS Referring Phys: [id number] [facility]  Sonographer Comments: Suboptimal parasternal window. IMPRESSIONS  1. Left ventricular ejection fraction, by estimation, is >75%. The left ventricle has normal function. The left ventricle has no regional wall motion abnormalities. Left ventricular diastolic parameters are consistent with Grade I diastolic dysfunction (impaired relaxation).  2. Right ventricular systolic function is normal. The right ventricular size is normal.  3. The mitral valve is normal in structure. No evidence of mitral valve regurgitation. No evidence of mitral stenosis.  4. The aortic valve was not well visualized. There is mild calcification of the aortic valve. There is mild thickening of the aortic valve. Aortic valve regurgitation is not visualized. No aortic stenosis is present.  5. The inferior vena cava is normal in size with greater than 50% respiratory variability, suggesting right atrial pressure of 3 mmHg. FINDINGS  Left  Ventricle: Left ventricular ejection fraction, by estimation, is >75%. The left ventricle has normal function. The left ventricle has no regional wall motion abnormalities. The left ventricular internal cavity size was normal in size. There is no left ventricular hypertrophy. Left ventricular diastolic parameters are consistent with Grade I diastolic dysfunction (impaired relaxation). Normal left ventricular filling pressure. Right Ventricle: The right ventricular size is  normal. Right vetricular wall thickness was not well visualized. Right ventricular systolic function is normal. Left Atrium: Left atrial size was normal in size. Right Atrium: Right atrial size was normal in size. Pericardium: There is no evidence of pericardial effusion. Mitral Valve: The mitral valve is normal in structure. No evidence of mitral valve regurgitation. No evidence of mitral valve stenosis. MV peak gradient, 7.1 mmHg. The mean mitral valve gradient is 2.0 mmHg. Tricuspid Valve: The tricuspid valve is normal in structure. Tricuspid valve regurgitation is not demonstrated. No evidence of tricuspid stenosis. Aortic Valve: The aortic valve was not well visualized. There is mild calcification of the aortic valve. There is mild thickening of the aortic valve. There is mild aortic valve annular calcification. Aortic valve regurgitation is not visualized. No aortic stenosis is present. Aortic valve mean gradient measures 5.9 mmHg. Aortic valve peak gradient measures 11.2 mmHg. Aortic valve area, by VTI measures 1.89 cm. Pulmonic Valve: The pulmonic valve was not well visualized. Pulmonic valve regurgitation is not visualized. No evidence of pulmonic stenosis. Aorta: The aortic root is normal in size and structure. Venous: The inferior vena cava is normal in size with greater than 50% respiratory variability, suggesting right atrial pressure of 3 mmHg. IAS/Shunts: The interatrial septum is aneurysmal. No atrial level shunt detected by color  flow Doppler.  LEFT VENTRICLE PLAX 2D LVIDd:         4.00 cm     Diastology LVIDs:         2.30 cm     LV e' medial:    8.27 cm/s LV PW:         0.80 cm     LV E/e' medial:  10.3 LV IVS:        1.00 cm     LV e' lateral:   5.87 cm/s LVOT diam:     1.80 cm     LV E/e' lateral: 14.5 LV SV:         67 LV SV Index:   40 LVOT Area:     2.54 cm  LV Volumes (MOD) LV vol d, MOD A2C: 57.6 ml LV vol d, MOD A4C: 45.9 ml LV vol s, MOD A2C: 12.8 ml LV vol s, MOD A4C: 12.3 ml LV SV MOD A2C:     44.8 ml LV SV MOD A4C:     45.9 ml LV SV MOD BP:      38.2 ml RIGHT VENTRICLE             IVC RV S prime:     14.80 cm/s  IVC diam: 0.80 cm TAPSE (M-mode): 2.2 cm LEFT ATRIUM             Index        RIGHT ATRIUM          Index LA diam:        2.80 cm 1.66 cm/m   RA Area:     9.79 cm LA Vol (A2C):   26.2 ml 15.52 ml/m  RA Volume:   20.60 ml 12.20 ml/m LA Vol (A4C):   28.7 ml 17.00 ml/m LA Biplane Vol: 28.7 ml 17.00 ml/m  AORTIC VALVE AV Area (Vmax):    1.98 cm AV Area (Vmean):   2.01 cm AV Area (VTI):     1.89 cm AV Vmax:           167.48 cm/s AV Vmean:          111.456 cm/s AV VTI:  0.357 m AV Peak Grad:      11.2 mmHg AV Mean Grad:      5.9 mmHg LVOT Vmax:         130.00 cm/s LVOT Vmean:        88.000 cm/s LVOT VTI:          0.265 m LVOT/AV VTI ratio: 0.74  AORTA Ao Root diam: 2.90 cm MITRAL VALVE MV Area (PHT): 3.12 cm     SHUNTS MV Area VTI:   2.30 cm     Systemic VTI:  0.26 m MV Peak grad:  7.1 mmHg     Systemic Diam: 1.80 cm MV Mean grad:  2.0 mmHg MV Vmax:       1.33 m/s MV Vmean:      67.3 cm/s MV Decel Time: 243 msec MV E velocity: 84.90 cm/s MV A velocity: 123.00 cm/s MV E/A ratio:  0.69 Carlyle Dolly MD Electronically signed by Carlyle Dolly MD Signature Date/Time: 06/15/2022/1:38:17 PM    Final    DG CHEST PORT 1 VIEW  Result Date: 06/14/2022 CLINICAL DATA:  Reason for exam: Chest tube in place 2 for 2: Jocelyn EXAM: PORTABLE CHEST - 1 VIEW COMPARISON:  06/13/2022 and three views FINDINGS: Stable  pigtail catheter laterally at the right lung base. Probable skin folds project over the right lung, no convincing pneumothorax. Coarse airspace opacities at the right lung base, partially improved since previous. Left lung clear. Heart size and mediastinal contours are within normal limits. Aortic Atherosclerosis (ICD10-170.0). No effusion. Visualized bones unremarkable. IMPRESSION: 1. Stable right chest tube. No convincing pneumothorax. 2. Partial improvement in right lower lung airspace disease. Electronically Signed   By: Lucrezia Europe M.D.   On: 06/14/2022 09:20   DG CHEST PORT 1 VIEW  Result Date: 06/13/2022 CLINICAL DATA:  Pleural effusion.  Chest tube. EXAM: PORTABLE CHEST 1 VIEW COMPARISON:  AP chest 06/12/2022, chest two views 06/11/2022, CT chest 06/11/2022 FINDINGS: Cardiac silhouette and mediastinal contours are within normal limits. Moderate calcification within the aortic arch. Unchanged right pigtail drainage catheter position. The pigtail appears possibly minimally more open compared to prior however this may be due to projection. This may be due to minimal pullback tension on the catheter. No pneumothorax is seen. Mildly improved aeration of the right mid to lower lung, with predominantly horizontal linear density and more mild heterogeneous airspace opacity compared to prior. Likely tiny right pleural effusion, noting loculated right pleural fluid on recent 06/11/2022 CT prior to the chest tube placement. No acute skeletal abnormality. IMPRESSION: 1. Unchanged position of right pigtail drainage catheter. The pigtail appears minimally more open compared to prior. This may be due to projection or minimal pullback tension on the catheter. 2. Mildly improved aeration of the right mid to lower lung. Persistent linear and heterogeneous right basilar airspace opacities. 3. Likely tiny right pleural effusion, noting loculated fluid on recent 06/11/2022 CT. Electronically Signed   By: Yvonne Kendall M.D.    On: 06/13/2022 08:14   DG Chest Port 1 View  Result Date: 06/12/2022 CLINICAL DATA:  Chest tube.  Pleural effusion EXAM: PORTABLE CHEST 1 VIEW COMPARISON:  Yesterday FINDINGS: Right chest tube in place. No visible pneumothorax. Increased hazy opacity at the right base which could be pneumonia or superimposed pleural fluid. Interstitial coarsening in the remaining lungs is stable. Stable cardiomegaly and aortic tortuosity. IMPRESSION: Increased density at the right base which could be pleural fluid or pneumonia. Electronically Signed   By: Gilford Silvius.D.  On: 06/12/2022 04:43   DG Chest Port 1 View  Result Date: 06/11/2022 CLINICAL DATA:  Chest tube placement EXAM: PORTABLE CHEST 1 VIEW COMPARISON:  Same-day x-ray FINDINGS: Interval placement of right-sided chest tube with decreased volume of right-sided pleural effusion, now small volume. Improving aeration of the right lung base. Left lung is clear. Normal heart size. Aortic atherosclerosis. No pneumothorax. IMPRESSION: Interval placement of right-sided chest tube with decreased volume of right-sided pleural effusion, now small volume. Improving aeration of the right lung base. Electronically Signed   By: Davina Poke D.O.   On: 06/11/2022 18:00   CT Chest Wo Contrast  Result Date: 06/11/2022 CLINICAL DATA:  Pleural effusion, malignancy suspected EXAM: CT CHEST WITHOUT CONTRAST TECHNIQUE: Multidetector CT imaging of the chest was performed following the standard protocol without IV contrast. RADIATION DOSE REDUCTION: This exam was performed according to the departmental dose-optimization program which includes automated exposure control, adjustment of the mA and/or kV according to patient size and/or use of iterative reconstruction technique. COMPARISON:  CT chest angio dated June 03, 2022 FINDINGS: Cardiovascular: Normal heart size. No pericardial effusion. Severe left main and three-vessel coronary artery calcifications. Normal  caliber thoracic aorta with severe calcified plaque. Mediastinum/Nodes: Esophagus and thyroid are unremarkable. Enlarged lymph nodes of the right cardiophrenic angle. Reference node measuring 1.3 cm on series 4, image 98 and reference node measuring 1.1 cm on image 98. Lungs/Pleura: Central airways are patent. Mild centrilobular emphysema. Moderate to large loculated right pleural effusion with areas of subtle pleural nodular thickening, better appreciated on today's exam due to differences in technique. Nodular thickening of the fissures of the right lung. Linear opacity of the lingula, likely due to scarring or atelectasis. No consolidation or pneumothorax. Upper Abdomen: Bilateral simple appearing partially visualized renal lesions, likely simple cysts with no specific follow-up imaging recommended for these lesions. Low-density thickening of the bilateral adrenal glands, possibly due to adenomas, although metastatic disease is not entirely excluded. Musculoskeletal: Lucent lucent lesion of the first left rib with associated nondisplaced fracture. IMPRESSION: 1. Moderate to large loculated right pleural effusion, increased in size when compared with prior exam. There is associated pleural nodularity and nodular fissural thickening, highly concerning for malignant pleural effusion. Recommend further evaluation with fluid sampling. 2. Enlarged right cardiophrenic angle lymph nodes, concerning for metastatic disease. 3. Low-density thickening of the bilateral adrenal glands possibly due to adenomas, although metastatic disease can not be excluded. Recommend attention on follow-up. 4. Lucent lesion of the first left rib with associated subacute appearing fracture, concerning for osseous metastatic disease and pathologic fracture. 5. Severe left main and three-vessel coronary artery calcifications. 6. Aortic Atherosclerosis (ICD10-I70.0) and Emphysema (ICD10-J43.9). Electronically Signed   By: Yetta Glassman M.D.    On: 06/11/2022 15:34   DG Chest 2 View  Result Date: 06/11/2022 CLINICAL DATA:  Shortness of breath and chest pain EXAM: CHEST - 2 VIEW COMPARISON:  06/10/2022 FINDINGS: Persistence and probable slight further enlargement of pleural effusion on the right. Compressive volume loss of the right lower lung. Left lung shows mild chronic markings but no evidence of infiltrate, collapse or effusion on that side. IMPRESSION: Probable slight further enlargement of the right pleural effusion. Compressive volume loss of the right lower lung. Electronically Signed   By: Nelson Chimes M.D.   On: 06/11/2022 13:43   DG Chest Right Decubitus  Result Date: 06/10/2022 CLINICAL DATA:  Pneumonia.  Short of breath EXAM: CHEST - RIGHT DECUBITUS COMPARISON:  Chest two-view 06/10/2022.  CT  chest 06/03/2022 FINDINGS: Moderately large right pleural effusion which is layering. The effusion appears partially loculated on CT. There is consolidation in the right lung base. Left lung clear. IMPRESSION: Moderately large right pleural effusion which layers. This may be partially loculated based on the recent CT. Electronically Signed   By: Franchot Gallo M.D.   On: 06/10/2022 17:33   DG Chest 2 View  Result Date: 06/10/2022 CLINICAL DATA:  Pneumonia. EXAM: CHEST - 2 VIEW COMPARISON:  CT chest 06/03/2022.  Chest two-view 06/02/2022 FINDINGS: Moderate right pleural effusion slightly larger. Right lower lobe and right middle lobe consolidation unchanged from the prior chest x-ray. Left lung clear. Negative for heart failure. Atherosclerotic calcification aortic arch. IMPRESSION: Mild progression of right pleural effusion which is moderately large. Right lower lobe consolidation right middle lobe consolidation unchanged. Electronically Signed   By: Franchot Gallo M.D.   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 The cytology from the right pleural effusion was highly suspicious for malignancy with large atypical cells but more tissue may be needed to establish the histology of her disease.  PLAN: I had a lengthy discussion with the patient today about her current disease stage, prognosis and treatment options. I personally and independently reviewed the scan images and discussed the result with the patient today. I recommended for the patient to complete the staging work-up by ordering MRI of the brain to rule out brain metastasis. I will also request a CT-guided core biopsy of the lesion in the musculature of the left thigh for confirmation of the tissue diagnosis and also to have more tissue for molecular studies. For the left sided chest pain, I will give the patient prescription for Percocet for now. I will send blood test to [id number] for molecular studies. I will see the patient back for follow-up visit in 2-3 weeks for evaluation and more detailed discussion of her treatment options based on the final pathology and staging work-up. The patient was advised to call immediately if she has any other concerning symptoms in the interval. The patient voices understanding of current disease status and treatment options and is in agreement with the current care plan.  All questions were answered. The patient knows to call the clinic with any problems, questions or concerns. We can certainly see the patient much sooner if necessary.  Thank you so much for allowing me to participate in the care of  Allison [name]. I will continue to follow up the patient with you and assist in her care.  The total time spent in the appointment was 90 minutes.  Disclaimer: This note was dictated with voice recognition software. Similar sounding words can inadvertently be transcribed and may not be corrected upon review.   Eilleen Kempf July 09, 2022, 2:37 PM

## 2022-07-10 NOTE — Progress Notes (Signed)
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    [place]

## 2022-07-14 ENCOUNTER — Ambulatory Visit (HOSPITAL_COMMUNITY)
Admission: RE | Admit: 2022-07-14 | Discharge: 2022-07-14 | Disposition: A | Discharge status: Home / Self Care | Payer: Medicare HMO | Source: Ambulatory Visit | Attending: Internal Medicine | Admitting: Internal Medicine

## 2022-07-14 DIAGNOSIS — C3490 Malignant neoplasm of unspecified part of unspecified bronchus or lung: Secondary | ICD-10-CM | POA: Insufficient documentation

## 2022-07-14 DIAGNOSIS — G319 Degenerative disease of nervous system, unspecified: Secondary | ICD-10-CM | POA: Diagnosis not present

## 2022-07-14 DIAGNOSIS — C3491 Malignant neoplasm of unspecified part of right bronchus or lung: Secondary | ICD-10-CM | POA: Diagnosis not present

## 2022-07-14 MED ORDER — GADOBUTROL 1 MMOL/ML IV SOLN
4.5000 mL | Freq: Once | INTRAVENOUS | Status: AC | PRN
  Administered 2022-07-14: 4.5 mL via INTRAVENOUS

## 2022-07-15 ENCOUNTER — Encounter: Payer: Self-pay | Admitting: Internal Medicine

## 2022-07-15 ENCOUNTER — Telehealth: Payer: Self-pay | Admitting: Medical Oncology

## 2022-07-15 NOTE — Telephone Encounter (Signed)
Faxed records to [facility].

## 2022-07-16 DIAGNOSIS — M79604 Pain in right leg: Secondary | ICD-10-CM | POA: Diagnosis not present

## 2022-07-16 DIAGNOSIS — C3490 Malignant neoplasm of unspecified part of unspecified bronchus or lung: Secondary | ICD-10-CM | POA: Diagnosis not present

## 2022-07-16 DIAGNOSIS — R110 Nausea: Secondary | ICD-10-CM | POA: Diagnosis not present

## 2022-07-21 LAB — GUARDANT 360

## 2022-07-25 ENCOUNTER — Encounter: Payer: Self-pay | Admitting: Surgery

## 2022-07-31 ENCOUNTER — Other Ambulatory Visit: Payer: Self-pay | Admitting: Student

## 2022-07-31 DIAGNOSIS — C3491 Malignant neoplasm of unspecified part of right bronchus or lung: Secondary | ICD-10-CM

## 2022-08-01 ENCOUNTER — Ambulatory Visit (HOSPITAL_COMMUNITY): Admission: RE | Admit: 2022-08-01 | Payer: Medicare HMO | Source: Ambulatory Visit

## 2022-08-01 ENCOUNTER — Ambulatory Visit (HOSPITAL_COMMUNITY): Payer: Medicare HMO

## 2022-08-06 ENCOUNTER — Inpatient Hospital Stay: Payer: Medicare HMO | Admitting: Internal Medicine

## 2022-08-06 ENCOUNTER — Inpatient Hospital Stay: Payer: Medicare HMO | Attending: Internal Medicine

## 2022-08-08 ENCOUNTER — Other Ambulatory Visit: Payer: Self-pay | Admitting: Physician Assistant

## 2022-08-25 DIAGNOSIS — 419620001 Death: Secondary | SNOMED CT | POA: Diagnosis not present

## 2022-08-25 NOTE — Progress Notes (Signed)
Attempted to reach pt as to why she did not make it to her biopsy on 12/8. There was no answer on her mobile number and her VM box is full. I tried her husband, Olga Millers, as well. There was no answer and his VM box is full.

## 2022-08-25 DEATH — deceased
